# Patient Record
Sex: Male | Born: 1970
Health system: Southern US, Community
[De-identification: ages and names within clinical notes are randomized; demographics above are authoritative.]

## PROBLEM LIST (undated history)

## (undated) DIAGNOSIS — I10 Essential (primary) hypertension: Secondary | ICD-10-CM

## (undated) HISTORY — PX: MULTIPLE TOOTH EXTRACTIONS: SHX2053

---

## 2018-08-09 ENCOUNTER — Ambulatory Visit (INDEPENDENT_AMBULATORY_CARE_PROVIDER_SITE_OTHER): Payer: BLUE CROSS/BLUE SHIELD | Admitting: Physician Assistant

## 2018-08-09 ENCOUNTER — Other Ambulatory Visit: Payer: Self-pay

## 2018-08-09 ENCOUNTER — Encounter: Payer: Self-pay | Admitting: Physician Assistant

## 2018-08-09 VITALS — BP 159/101 | HR 72 | Temp 98.5°F | Resp 16 | Ht 75.0 in | Wt 222.0 lb

## 2018-08-09 DIAGNOSIS — Z23 Encounter for immunization: Secondary | ICD-10-CM

## 2018-08-09 DIAGNOSIS — Z131 Encounter for screening for diabetes mellitus: Secondary | ICD-10-CM | POA: Diagnosis not present

## 2018-08-09 DIAGNOSIS — Z125 Encounter for screening for malignant neoplasm of prostate: Secondary | ICD-10-CM

## 2018-08-09 DIAGNOSIS — Z1211 Encounter for screening for malignant neoplasm of colon: Secondary | ICD-10-CM

## 2018-08-09 DIAGNOSIS — Z0000 Encounter for general adult medical examination without abnormal findings: Secondary | ICD-10-CM | POA: Diagnosis not present

## 2018-08-09 DIAGNOSIS — Z136 Encounter for screening for cardiovascular disorders: Secondary | ICD-10-CM

## 2018-08-09 DIAGNOSIS — Z111 Encounter for screening for respiratory tuberculosis: Secondary | ICD-10-CM

## 2018-08-09 DIAGNOSIS — Z1329 Encounter for screening for other suspected endocrine disorder: Secondary | ICD-10-CM

## 2018-08-09 DIAGNOSIS — Z13220 Encounter for screening for lipoid disorders: Secondary | ICD-10-CM

## 2018-08-09 DIAGNOSIS — Z1159 Encounter for screening for other viral diseases: Secondary | ICD-10-CM

## 2018-08-09 DIAGNOSIS — Z789 Other specified health status: Secondary | ICD-10-CM

## 2018-08-09 NOTE — Patient Instructions (Signed)

## 2018-08-09 NOTE — Progress Notes (Signed)
Patient: Jorge Phillips, Male    DOB: 08-23-1970, 48 y.o.   MRN: [id number] Visit Date: 08/09/2018  Today's Provider: Margaretann LovelessJennifer M Krystalle Pilkington, PA-C   Chief Complaint  Patient presents with  . New Patient (Initial Visit)  . Establish Care   Subjective:    I,Joseline E. Rosas,RMA am acting as a Neurosurgeonscribe for PPG IndustriesJennifer M. Rosamond Andress, PA-C.   Establish Care/Annual physical exam Jorge Phillips is a 48 y.o. male who presents today for health maintenance and complete physical. He feels well. He reports exercising some. He reports he is sleeping well. [place] and [place] move down to [place]. -----------------------------------------------------------------   Review of Systems  Constitutional: Positive for appetite change and fatigue.  HENT: Negative.   Eyes: Negative.   Respiratory: Negative.   Cardiovascular: Negative.   Gastrointestinal: Negative.   Endocrine: Negative.   Genitourinary: Negative.   Musculoskeletal: Negative.   Skin: Negative.   Allergic/Immunologic: Negative.   Neurological: Positive for headaches.  Hematological: Negative.     Social History      He  reports that he has been smoking. He does not have any smokeless tobacco history on file. He reports current alcohol use of about 7.0 standard drinks of alcohol per week.       Social History   Socioeconomic History  . Marital status: Single    Spouse name: Not on file  . Number of children: Not on file  . Years of education: Not on file  . Highest education level: Not on file  Occupational History  . Not on file  Social Needs  . Financial resource strain: Not on file  . Food insecurity    Worry: Not on file    Inability: Not on file  . Transportation needs    Medical: Not on file    Non-medical: Not on file  Tobacco Use  . Smoking status: Current Every Day Smoker  Substance and Sexual Activity  . Alcohol use: Yes    Alcohol/week: 7.0 standard drinks    Types: 1 Glasses of  wine, 6 Cans of beer per week  . Drug use: Not on file  . Sexual activity: Not on file  Lifestyle  . Physical activity    Days per week: Not on file    Minutes per session: Not on file  . Stress: Not on file  Relationships  . Social Musicianconnections    Talks on phone: Not on file    Gets together: Not on file    Attends religious service: Not on file    Active member of club or organization: Not on file    Attends meetings of clubs or organizations: Not on file    Relationship status: Not on file  Other Topics Concern  . Not on file  Social History Narrative  . Not on file    No past medical history on file.   There are no active problems to display for this patient.   Family History        Family Status  Relation Name Status  . Mother  (Not Specified)  . Father  (Not Specified)        His family history includes Cancer in his father and mother.      Not on File  No current outpatient medications on file.   Patient Care Team: Reine JustBurnette, Rodolfo Notaro M, PA-C as PCP - General (Family Medicine)    Objective:    Vitals: BP (!) 159/101 (  BP Location: Right Arm, Patient Position: Sitting, Cuff Size: Large)   Pulse 72   Temp 98.5 F (36.9 C) (Oral)   Resp 16   Ht 6\' 3"  (1.905 m)   Wt 222 lb (100.7 kg)   SpO2 99%   BMI 27.75 kg/m    Vitals:   08/09/18 1527  BP: (!) 159/101  Pulse: 72  Resp: 16  Temp: 98.5 F (36.9 C)  TempSrc: Oral  SpO2: 99%  Weight: 222 lb (100.7 kg)  Height: 6\' 3"  (1.905 m)     Physical Exam Vitals signs reviewed.  Constitutional:      General: He is not in acute distress.    Appearance: Normal appearance. He is well-developed and normal weight. He is not ill-appearing.  HENT:     Head: Normocephalic and atraumatic.     Right Ear: Tympanic membrane, ear canal and external ear normal.     Left Ear: Tympanic membrane, ear canal and external ear normal.     Nose: Nose normal.     Mouth/Throat:     Mouth: Mucous membranes are moist.      Pharynx: No posterior oropharyngeal erythema.  Eyes:     General: No scleral icterus.    Extraocular Movements: Extraocular movements intact.     Conjunctiva/sclera: Conjunctivae normal.     Pupils: Pupils are equal, round, and reactive to light.  Neck:     Musculoskeletal: Normal range of motion and neck supple.     Thyroid: No thyromegaly.     Trachea: No tracheal deviation.  Cardiovascular:     Rate and Rhythm: Normal rate and regular rhythm.     Pulses: Normal pulses.     Heart sounds: Normal heart sounds. No murmur.  Pulmonary:     Effort: Pulmonary effort is normal. No respiratory distress.     Breath sounds: Normal breath sounds. No wheezing or rales.  Chest:     Chest wall: No tenderness.  Abdominal:     General: Abdomen is flat. There is no distension.     Palpations: Abdomen is soft. There is no mass.     Tenderness: There is no abdominal tenderness. There is no guarding or rebound.  Musculoskeletal: Normal range of motion.        General: No tenderness.  Lymphadenopathy:     Cervical: No cervical adenopathy.  Skin:    General: Skin is warm and dry.     Capillary Refill: Capillary refill takes less than 2 seconds.     Findings: No erythema or rash.  Neurological:     General: No focal deficit present.     Mental Status: He is alert and oriented to person, place, and time. Mental status is at baseline.     Cranial Nerves: No cranial nerve deficit.     Motor: No weakness or abnormal muscle tone.     Coordination: Coordination normal.     Gait: Gait normal.     Deep Tendon Reflexes: Reflexes are normal and symmetric. Reflexes normal.  Psychiatric:        Mood and Affect: Mood normal.        Behavior: Behavior normal.        Thought Content: Thought content normal.        Judgment: Judgment normal.      Depression Screen PHQ 2/9 Scores 08/09/2018  PHQ - 2 Score 0       Assessment & Plan:     Routine Health Maintenance and Physical Exam  Exercise  Activities and Dietary recommendations Goals   None      There is no immunization history on file for this patient.  Health Maintenance  Topic Date Due  . HIV Screening  07/29/1986  . TETANUS/TDAP  07/29/1990  . INFLUENZA VACCINE  09/29/2018     Discussed health benefits of physical activity, and encouraged him to engage in regular exercise appropriate for his age and condition.    1. Annual physical exam Normal physical exam today. Will check labs as below and f/u pending lab results. If labs are stable and WNL he will not need to have these rechecked for one year at his next annual physical exam. He is to call the office in the meantime if he has any acute issue, questions or concerns. - CBC w/Diff/Platelet - Comprehensive Metabolic Panel (CMET) - TSH - Lipid Profile - HgB A1c - PSA  2. Colon cancer screening Referral placed for initial screening colonoscopy. - Ambulatory referral to Gastroenterology  3. Prostate cancer screening No symptoms. Initial baseline. Will check labs as below and f/u pending results. - PSA  4. Diabetes mellitus screening Will check labs as below and f/u pending results. - HgB A1c  5. Thyroid disorder screen Will check labs as below and f/u pending results. - TSH  6. Encounter for lipid screening for cardiovascular disease Will check labs as below and f/u pending results. - Lipid Profile  7. Need for hepatitis B vaccination Hep B #1 Vaccine given to patient without complications. Patient sat for 15 minutes after administration and was tolerated well without adverse effects. Return in 4 weeks for Hep B #2.  - Hepatitis B vaccine adult IM  8. Need for Tdap vaccination Tdap Vaccine given to patient without complications. Patient sat for 15 minutes after administration and was tolerated well without adverse effects. - Tdap vaccine greater than or equal to 7yo IM  9. Screening-pulmonary TB Checking titers since patient is starting [employer] in  the fall for [employer] and does not have any vaccination records. Will check labs as below and f/u pending results. - QuantiFERON-TB Gold Plus  10. Screening examination for measles Will check labs as below and f/u pending results. - Measles/Mumps/Rubella Immunity  11. Varicella vaccination status unknown Will check labs as below and f/u pending results. - Varicella Zoster Abs, IgG/IgM  --------------------------------------------------------------------    Margaretann LovelessJennifer M Bogdan Vivona, PA-C  [facility]

## 2018-08-15 DIAGNOSIS — Z1159 Encounter for screening for other viral diseases: Secondary | ICD-10-CM | POA: Diagnosis not present

## 2018-08-15 DIAGNOSIS — Z111 Encounter for screening for respiratory tuberculosis: Secondary | ICD-10-CM | POA: Diagnosis not present

## 2018-08-15 DIAGNOSIS — Z789 Other specified health status: Secondary | ICD-10-CM | POA: Diagnosis not present

## 2018-08-15 DIAGNOSIS — Z136 Encounter for screening for cardiovascular disorders: Secondary | ICD-10-CM | POA: Diagnosis not present

## 2018-08-15 DIAGNOSIS — Z0000 Encounter for general adult medical examination without abnormal findings: Secondary | ICD-10-CM | POA: Diagnosis not present

## 2018-08-17 ENCOUNTER — Telehealth: Payer: Self-pay

## 2018-08-17 LAB — HEMOGLOBIN A1C
Est. average glucose Bld gHb Est-mCnc: 80 mg/dL
Hgb A1c MFr Bld: 4.4 % — ABNORMAL LOW (ref 4.8–5.6)

## 2018-08-17 LAB — CBC WITH DIFFERENTIAL/PLATELET
Basophils Absolute: 0 10*3/uL (ref 0.0–0.2)
Basos: 1 %
EOS (ABSOLUTE): 0.2 10*3/uL (ref 0.0–0.4)
Eos: 4 %
Hematocrit: 36.7 % — ABNORMAL LOW (ref 37.5–51.0)
Hemoglobin: 13.1 g/dL (ref 13.0–17.7)
Immature Grans (Abs): 0 10*3/uL (ref 0.0–0.1)
Immature Granulocytes: 0 %
Lymphocytes Absolute: 1.8 10*3/uL (ref 0.7–3.1)
Lymphs: 42 %
MCH: 32.9 pg (ref 26.6–33.0)
MCHC: 35.7 g/dL (ref 31.5–35.7)
MCV: 92 fL (ref 79–97)
Monocytes Absolute: 0.3 10*3/uL (ref 0.1–0.9)
Monocytes: 6 %
Neutrophils Absolute: 2 10*3/uL (ref 1.4–7.0)
Neutrophils: 47 %
Platelets: 185 10*3/uL (ref 150–450)
RBC: 3.98 x10E6/uL — ABNORMAL LOW (ref 4.14–5.80)
RDW: 13.6 % (ref 11.6–15.4)
WBC: 4.3 10*3/uL (ref 3.4–10.8)

## 2018-08-17 LAB — COMPREHENSIVE METABOLIC PANEL
ALT: 50 IU/L — ABNORMAL HIGH (ref 0–44)
AST: 25 IU/L (ref 0–40)
Albumin/Globulin Ratio: 1.8 (ref 1.2–2.2)
Albumin: 4.9 g/dL (ref 4.0–5.0)
Alkaline Phosphatase: 52 IU/L (ref 39–117)
BUN/Creatinine Ratio: 10 (ref 9–20)
BUN: 10 mg/dL (ref 6–24)
Bilirubin Total: 1.1 mg/dL (ref 0.0–1.2)
CO2: 25 mmol/L (ref 20–29)
Calcium: 9.5 mg/dL (ref 8.7–10.2)
Chloride: 100 mmol/L (ref 96–106)
Creatinine, Ser: 1 mg/dL (ref 0.76–1.27)
GFR calc Af Amer: 103 mL/min/{1.73_m2} (ref >59)
GFR calc non Af Amer: 89 mL/min/{1.73_m2} (ref >59)
Globulin, Total: 2.7 g/dL (ref 1.5–4.5)
Glucose: 109 mg/dL — ABNORMAL HIGH (ref 65–99)
Potassium: 3.7 mmol/L (ref 3.5–5.2)
Sodium: 138 mmol/L (ref 134–144)
Total Protein: 7.6 g/dL (ref 6.0–8.5)

## 2018-08-17 LAB — MEASLES/MUMPS/RUBELLA IMMUNITY
MUMPS ABS, IGG: >300 AU/mL (ref >10.9)
RUBEOLA AB, IGG: 35.2 AU/mL (ref >16.4)
Rubella Antibodies, IGG: 7.32 index (ref >0.99)

## 2018-08-17 LAB — QUANTIFERON-TB GOLD PLUS
QuantiFERON Mitogen Value: >10 IU/mL
QuantiFERON Nil Value: 0.02 IU/mL
QuantiFERON TB1 Ag Value: 0.02 IU/mL
QuantiFERON TB2 Ag Value: 0.02 IU/mL
QuantiFERON-TB Gold Plus: NEGATIVE

## 2018-08-17 LAB — LIPID PANEL
Chol/HDL Ratio: 4.6 ratio (ref 0.0–5.0)
Cholesterol, Total: 153 mg/dL (ref 100–199)
HDL: 33 mg/dL — ABNORMAL LOW (ref >39)
LDL Calculated: 62 mg/dL (ref 0–99)
Triglycerides: 291 mg/dL — ABNORMAL HIGH (ref 0–149)
VLDL Cholesterol Cal: 58 mg/dL — ABNORMAL HIGH (ref 5–40)

## 2018-08-17 LAB — PSA: Prostate Specific Ag, Serum: 0.3 ng/mL (ref 0.0–4.0)

## 2018-08-17 LAB — TSH: TSH: 1.54 u[IU]/mL (ref 0.450–4.500)

## 2018-08-17 LAB — VARICELLA ZOSTER ABS, IGG/IGM
Varicella IgM: <0.91 index (ref 0.00–0.90)
Varicella zoster IgG: <135 index — ABNORMAL LOW (ref >165)

## 2018-08-17 NOTE — Telephone Encounter (Signed)
-----  Message from Jennifer M Burnette, PA-C sent at 08/17/2018  1:19 PM EDT ----- MMR immune. Quantiferon gold for TB screening is negative. Varicella you are not immune, which was to be expected. Since you have never had chicken pox when you return for your second Hep B vaccine you could get the varicella vaccine as well. If you were to be around someone with chicken pox or shingles you could get it if you are not vaccinated. However, for school it is not required since your birthday is before [birth year]. Just let us know if you would like the varicella vaccine. Blood count is normal. Kidney function is normal. ALT (one liver enzyme) was borderline high. I suspect this is falsely elevated since labs were done right after we had given you the Hep B vaccine (which could increase this number). I would recommend us just follow this annually at this time and we can recheck next year. Thyroid is normal. Cholesterol is normal. Triglycerides were elevated. This is most often related to dietary habits. Limit fatty foods and red meats in diet. A1c is normal. PSA is normal. 

## 2018-08-17 NOTE — Telephone Encounter (Signed)
LMTCB

## 2018-08-20 NOTE — Telephone Encounter (Signed)
Patient advised as below. Patient verbalizes understanding and is in agreement with treatment plan.  

## 2018-08-20 NOTE — Telephone Encounter (Signed)
LMTCB

## 2018-09-07 ENCOUNTER — Ambulatory Visit (INDEPENDENT_AMBULATORY_CARE_PROVIDER_SITE_OTHER): Payer: BLUE CROSS/BLUE SHIELD | Admitting: Physician Assistant

## 2018-09-07 ENCOUNTER — Other Ambulatory Visit: Payer: Self-pay

## 2018-09-07 ENCOUNTER — Encounter: Payer: Self-pay | Admitting: Physician Assistant

## 2018-09-07 DIAGNOSIS — Z23 Encounter for immunization: Secondary | ICD-10-CM | POA: Diagnosis not present

## 2018-09-07 NOTE — Progress Notes (Signed)
Hep B Vaccine #2 given to patient without complications. Patient sat for 15 minutes after administration and was tolerated well without adverse effects. He will return in 6 months for the third and final dose.

## 2018-09-20 ENCOUNTER — Ambulatory Visit (LOCAL_COMMUNITY_HEALTH_CENTER): Payer: BLUE CROSS/BLUE SHIELD

## 2018-09-20 ENCOUNTER — Other Ambulatory Visit: Payer: Self-pay

## 2018-09-20 DIAGNOSIS — Z23 Encounter for immunization: Secondary | ICD-10-CM | POA: Diagnosis not present

## 2018-09-20 NOTE — Progress Notes (Signed)
Client does not have PCP or pharmacy as recently moved to [place] from [place]. [facility] information sheet given to client. Client does not take any medications.

## 2018-09-24 DIAGNOSIS — L814 Other melanin hyperpigmentation: Secondary | ICD-10-CM | POA: Diagnosis not present

## 2018-09-24 DIAGNOSIS — D229 Melanocytic nevi, unspecified: Secondary | ICD-10-CM | POA: Diagnosis not present

## 2018-09-25 DIAGNOSIS — Z1211 Encounter for screening for malignant neoplasm of colon: Secondary | ICD-10-CM | POA: Diagnosis not present

## 2018-09-25 DIAGNOSIS — Z01818 Encounter for other preprocedural examination: Secondary | ICD-10-CM | POA: Diagnosis not present

## 2018-09-27 ENCOUNTER — Other Ambulatory Visit: Payer: Self-pay

## 2018-09-27 ENCOUNTER — Ambulatory Visit (INDEPENDENT_AMBULATORY_CARE_PROVIDER_SITE_OTHER): Payer: BLUE CROSS/BLUE SHIELD | Admitting: Physician Assistant

## 2018-09-27 ENCOUNTER — Encounter: Payer: Self-pay | Admitting: Physician Assistant

## 2018-09-27 VITALS — BP 169/98 | HR 66 | Temp 99.2°F | Resp 16 | Wt 224.6 lb

## 2018-09-27 DIAGNOSIS — M7542 Impingement syndrome of left shoulder: Secondary | ICD-10-CM | POA: Diagnosis not present

## 2018-09-27 DIAGNOSIS — M6752 Plica syndrome, left knee: Secondary | ICD-10-CM

## 2018-09-27 DIAGNOSIS — M25562 Pain in left knee: Secondary | ICD-10-CM | POA: Diagnosis not present

## 2018-09-27 MED ORDER — METHYLPREDNISOLONE 4 MG PO TBPK: ORAL_TABLET | ORAL | 0 refills | Status: DC

## 2018-09-27 NOTE — Progress Notes (Signed)
Patient: Jorge Phillips Male    DOB: 06-19-1970   48 y.o.   MRN: [id number] Visit Date: 09/27/2018  Today's Provider: Margaretann LovelessJennifer M Zubin Pontillo, PA-C   Chief Complaint  Patient presents with  . Knee Pain  . Shoulder Pain   Subjective:     HPI  Patient here today with c/o knee pain, left side with some swelling. He also reports that a week in half ago [place] the [place] and h[place] and since then he noticed the pain and stiffness on his knee and swelling.   He is also complaining of shoulder pain, left side. Patient reports that he doesn't remember any injury. Reports that he was on vacation and went to the beach and that's went he noticed his shoulder hurting. Patient has tried Advil and other pain medicines and not work out. Patient just received his varicella shot this week on Tuesday.  No Known Allergies  No current outpatient medications on file.  Review of Systems  Constitutional: Negative.   Respiratory: Negative.   Cardiovascular: Negative.   Musculoskeletal: Positive for arthralgias, joint swelling and myalgias.  Neurological: Negative.  Negative for weakness and numbness.    Social History   Tobacco Use  . Smoking status: Current Every Day Smoker  . Smokeless tobacco: Never Used  Substance Use Topics  . Alcohol use: Yes    Alcohol/week: 7.0 standard drinks    Types: 1 Glasses of wine, 6 Cans of beer per week      Objective:   BP (!) 169/98 (BP Location: Right Arm, Patient Position: Sitting, Cuff Size: Large)   Pulse 66   Temp 99.2 F (37.3 C) (Oral)   Resp 16   Wt 224 lb 9.6 oz (101.9 kg)   BMI 28.07 kg/m  Vitals:   09/27/18 1544  BP: (!) 169/98  Pulse: 66  Resp: 16  Temp: 99.2 F (37.3 C)  TempSrc: Oral  Weight: 224 lb 9.6 oz (101.9 kg)     Physical Exam Vitals signs reviewed.  Constitutional:      General: He is not in acute distress.    Appearance: Normal appearance. He is well-developed and normal weight. He is not ill-appearing or  diaphoretic.  HENT:     Head: Normocephalic and atraumatic.  Eyes:     General: No scleral icterus.    Extraocular Movements: Extraocular movements intact.  Neck:     Musculoskeletal: Normal range of motion and neck supple. Normal range of motion. No spinous process tenderness or muscular tenderness.  Cardiovascular:     Rate and Rhythm: Normal rate and regular rhythm.     Pulses: Normal pulses.     Heart sounds: Normal heart sounds. No murmur. No friction rub. No gallop.   Pulmonary:     Effort: Pulmonary effort is normal. No respiratory distress.     Breath sounds: Normal breath sounds. No wheezing or rales.  Musculoskeletal:     Left shoulder: He exhibits decreased range of motion and tenderness. He exhibits no bony tenderness, no swelling, no effusion, no crepitus, no deformity, no laceration, no pain, no spasm, normal pulse and normal strength.     Left knee: He exhibits swelling. He exhibits normal range of motion, no effusion, no ecchymosis, no deformity, no laceration, no erythema, normal alignment, no LCL laxity, normal patellar mobility, no bony tenderness, normal meniscus and no MCL laxity. Tenderness found. Medial joint line (along medial joint line he is tender over the medial plica which  feels slightly enlarged/swollen) tenderness noted.     Comments: Left shoulder: Positive Hawkin's Impingement, negative drop arm and empty can  Left Knee: Positive patellar grind, negative varus/valgus, lachman's, anterior posterior drawer, patellar apprehension and McMurray's  Neurological:     Mental Status: He is alert.      No results found for any visits on 09/27/18.     Assessment & Plan    1. Patellofemoral arthralgia of left knee Since he has multiple joints bothering him, will try medrol dose pak as below. Patient was also given exercises to do as well. Call if worsening.  - methylPREDNISolone (MEDROL) 4 MG TBPK tablet; 6 day taper; take as directed on package instructions   Dispense: 21 tablet; Refill: 0  2. Impingement syndrome of left shoulder See above medical treatment plan. - methylPREDNISolone (MEDROL) 4 MG TBPK tablet; 6 day taper; take as directed on package instructions  Dispense: 21 tablet; Refill: 0  3. Plica syndrome of knee, left See above medical treatment plan.     Mar Daring, PA-C  [facility]

## 2018-09-27 NOTE — Patient Instructions (Signed)
Kinesiology taping for patellofemoral pain if interested Knee compression brace with activity if needed Ice after activity Voltaren gel over the counter  Patellofemoral Pain Syndrome  Patellofemoral pain syndrome is a condition in which the tissue (cartilage) on the underside of the kneecap (patella) softens or breaks down. This causes pain in the front of the knee. The condition is also called runner's knee or chondromalacia patella. Patellofemoral pain syndrome is most common in young adults who are active in sports. The knee is the largest joint in the body. The patella covers the front of the knee and is attached to muscles above and below the knee. The underside of the patella is covered with a smooth type of cartilage (synovium). The smooth surface helps the patella to glide easily when you move your knee. Patellofemoral pain syndrome causes swelling in the joint linings and bone surfaces in the knee. What are the causes? This condition may be caused by:  Overuse of the knee.  Poor alignment of your knee joints.  Weak leg muscles.  A direct blow to your kneecap. What increases the risk? You are more likely to develop this condition if:  You do a lot of activities that can wear down your kneecap. These include: ? Running. ? Squatting. ? Climbing stairs.  You start a new physical activity or exercise program.  You wear shoes that do not fit well.  You do not have good leg strength.  You are overweight. What are the signs or symptoms? The main symptom of this condition is knee pain. This may feel like a dull, aching pain underneath your patella, in the front of your knee. There may be a popping or cracking sound when you move your knee. Pain may get worse with:  Exercise.  Climbing stairs.  Running.  Jumping.  Squatting.  Kneeling.  Sitting for a long time.  Moving or pushing on your patella. How is this diagnosed? This condition may be diagnosed based  on:  Your symptoms and medical history. You may be asked about your recent physical activities and which ones cause knee pain.  A physical exam. This may include: ? Moving your patella back and forth. ? Checking your range of knee motion. ? Having you squat or jump to see if you have pain. ? Checking the strength of your leg muscles.  Imaging tests to confirm the diagnosis. These may include an MRI of your knee. How is this treated? This condition may be treated at home with rest, ice, compression, and elevation (RICE).  Other treatments may include:  Nonsteroidal anti-inflammatory drugs (NSAIDs).  Physical therapy to stretch and strengthen your leg muscles.  Shoe inserts (orthotics) to take stress off your knee.  A knee brace or knee support.  Adhesive tapes to the skin.  Surgery to remove damaged cartilage or move the patella to a better position. This is rare. Follow these instructions at home: If you have a shoe or brace:  Wear the shoe or brace as told by your health care provider. Remove it only as told by your health care provider.  Loosen the shoe or brace if your toes tingle, become numb, or turn cold and blue.  Keep the shoe or brace clean.  If the shoe or brace is not waterproof: ? Do not let it get wet. ? Cover it with a watertight covering when you take a bath or a shower. Managing pain, stiffness, and swelling  If directed, put ice on the painful area. ? If  you have a removable shoe or brace, remove it as told by your health care provider. ? Put ice in a plastic bag. ? Place a towel between your skin and the bag. ? Leave the ice on for 20 minutes, 2-3 times a day.  Move your toes often to avoid stiffness and to lessen swelling.  Rest your knee: ? Avoid activities that cause knee pain. ? When sitting or lying down, raise (elevate) the injured area above the level of your heart, whenever possible. General instructions  Take over-the-counter and  prescription medicines only as told by your health care provider.  Use splints, braces, knee supports, or walking aids as directed by your health care provider.  Perform stretching and strengthening exercises as told by your health care provider or physical therapist.  Do not use any products that contain nicotine or tobacco, such as cigarettes and e-cigarettes. These can delay healing. If you need help quitting, ask your health care provider.  Return to your normal activities as told by your health care provider. Ask your health care provider what activities are safe for you.  Keep all follow-up visits as told by your health care provider. This is important. Contact a health care provider if:  Your symptoms get worse.  You are not improving with home care. Summary  Patellofemoral pain syndrome is a condition in which the tissue (cartilage) on the underside of the kneecap (patella) softens or breaks down.  This condition causes swelling in the joint linings and bone surfaces in the knee. This leads to pain in the front of the knee.  This condition may be treated at home with rest, ice, compression, and elevation (RICE).  Use splints, braces, knee supports, or walking aids as directed by your health care provider. This information is not intended to replace advice given to you by your health care provider. Make sure you discuss any questions you have with your health care provider. Document Released: 02/02/2009 Document Revised: 03/27/2017 Document Reviewed: 03/27/2017 Elsevier Patient Education  2020 [employer]   Journal for Nurse Practitioners, 15(4), [id number]. Retrieved December 04, 2017 from http://clinicalkey.com/nursing">  Knee Exercises Ask your health care provider which exercises are safe for you. Do exercises exactly as told by your health care provider and adjust them as directed. It is normal to feel mild stretching, pulling, tightness, or discomfort as you do these  exercises. Stop right away if you feel sudden pain or your pain gets worse. Do not begin these exercises until told by your health care provider. Stretching and range-of-motion exercises These exercises warm up your muscles and joints and improve the movement and flexibility of your knee. These exercises also help to relieve pain and swelling. Knee extension, prone 1. Lie on your abdomen (prone position) on a bed. 2. Place your left / right knee just beyond the edge of the surface so your knee is not on the bed. You can put a towel under your left / right thigh just above your kneecap for comfort. 3. Relax your leg muscles and allow gravity to straighten your knee (extension). You should feel a stretch behind your left / right knee. 4. Hold this position for __________ seconds. 5. Scoot up so your knee is supported between repetitions. Repeat __________ times. Complete this exercise __________ times a day. Knee flexion, active  1. Lie on your back with both legs straight. If this causes back discomfort, bend your left / right knee so your foot is flat on the floor. 2.  Slowly slide your left / right heel back toward your buttocks. Stop when you feel a gentle stretch in the front of your knee or thigh (flexion). 3. Hold this position for __________ seconds. 4. Slowly slide your left / right heel back to the starting position. Repeat __________ times. Complete this exercise __________ times a day. Quadriceps stretch, prone  1. Lie on your abdomen on a firm surface, such as a bed or padded floor. 2. Bend your left / right knee and hold your ankle. If you cannot reach your ankle or pant leg, loop a belt around your foot and grab the belt instead. 3. Gently pull your heel toward your buttocks. Your knee should not slide out to the side. You should feel a stretch in the front of your thigh and knee (quadriceps). 4. Hold this position for __________ seconds. Repeat __________ times. Complete this  exercise __________ times a day. Hamstring, supine 1. Lie on your back (supine position). 2. Loop a belt or towel over the ball of your left / right foot. The ball of your foot is on the walking surface, right under your toes. 3. Straighten your left / right knee and slowly pull on the belt to raise your leg until you feel a gentle stretch behind your knee (hamstring). ? Do not let your knee bend while you do this. ? Keep your other leg flat on the floor. 4. Hold this position for __________ seconds. Repeat __________ times. Complete this exercise __________ times a day. Strengthening exercises These exercises build strength and endurance in your knee. Endurance is the ability to use your muscles for a long time, even after they get tired. Quadriceps, isometric This exercise stretches the muscles in front of your thigh (quadriceps) without moving your knee joint (isometric). 1. Lie on your back with your left / right leg extended and your other knee bent. Put a rolled towel or small pillow under your knee if told by your health care provider. 2. Slowly tense the muscles in the front of your left / right thigh. You should see your kneecap slide up toward your hip or see increased dimpling just above the knee. This motion will push the back of the knee toward the floor. 3. For __________ seconds, hold the muscle as tight as you can without increasing your pain. 4. Relax the muscles slowly and completely. Repeat __________ times. Complete this exercise __________ times a day. Straight leg raises This exercise stretches the muscles in front of your thigh (quadriceps) and the muscles that move your hips (hip flexors). 1. Lie on your back with your left / right leg extended and your other knee bent. 2. Tense the muscles in the front of your left / right thigh. You should see your kneecap slide up or see increased dimpling just above the knee. Your thigh may even shake a bit. 3. Keep these muscles  tight as you raise your leg 4-6 inches (10-15 cm) off the floor. Do not let your knee bend. 4. Hold this position for __________ seconds. 5. Keep these muscles tense as you lower your leg. 6. Relax your muscles slowly and completely after each repetition. Repeat __________ times. Complete this exercise __________ times a day. Hamstring, isometric 1. Lie on your back on a firm surface. 2. Bend your left / right knee about __________ degrees. 3. Dig your left / right heel into the surface as if you are trying to pull it toward your buttocks. Tighten the muscles in the  back of your thighs (hamstring) to "dig" as hard as you can without increasing any pain. 4. Hold this position for __________ seconds. 5. Release the tension gradually and allow your muscles to relax completely for __________ seconds after each repetition. Repeat __________ times. Complete this exercise __________ times a day. Hamstring curls If told by your health care provider, do this exercise while wearing ankle weights. Begin with __________ lb weights. Then increase the weight by 1 lb (0.5 kg) increments. Do not wear ankle weights that are more than __________ lb. 1. Lie on your abdomen with your legs straight. 2. Bend your left / right knee as far as you can without feeling pain. Keep your hips flat against the floor. 3. Hold this position for __________ seconds. 4. Slowly lower your leg to the starting position. Repeat __________ times. Complete this exercise __________ times a day. Squats This exercise strengthens the muscles in front of your thigh and knee (quadriceps). 1. Stand in front of a table, with your feet and knees pointing straight ahead. You may rest your hands on the table for balance but not for support. 2. Slowly bend your knees and lower your hips like you are going to sit in a chair. ? Keep your weight over your heels, not over your toes. ? Keep your lower legs upright so they are parallel with the table  legs. ? Do not let your hips go lower than your knees. ? Do not bend lower than told by your health care provider. ? If your knee pain increases, do not bend as low. 3. Hold the squat position for __________ seconds. 4. Slowly push with your legs to return to standing. Do not use your hands to pull yourself to standing. Repeat __________ times. Complete this exercise __________ times a day. Wall slides This exercise strengthens the muscles in front of your thigh and knee (quadriceps). 1. Lean your back against a smooth wall or door, and walk your feet out 18-24 inches (46-61 cm) from it. 2. Place your feet hip-width apart. 3. Slowly slide down the wall or door until your knees bend __________ degrees. Keep your knees over your heels, not over your toes. Keep your knees in line with your hips. 4. Hold this position for __________ seconds. Repeat __________ times. Complete this exercise __________ times a day. Straight leg raises This exercise strengthens the muscles that rotate the leg at the hip and move it away from your body (hip abductors). 1. Lie on your side with your left / right leg in the top position. Lie so your head, shoulder, knee, and hip line up. You may bend your bottom knee to help you keep your balance. 2. Roll your hips slightly forward so your hips are stacked directly over each other and your left / right knee is facing forward. 3. Leading with your heel, lift your top leg 4-6 inches (10-15 cm). You should feel the muscles in your outer hip lifting. ? Do not let your foot drift forward. ? Do not let your knee roll toward the ceiling. 4. Hold this position for __________ seconds. 5. Slowly return your leg to the starting position. 6. Let your muscles relax completely after each repetition. Repeat __________ times. Complete this exercise __________ times a day. Straight leg raises This exercise stretches the muscles that move your hips away from the front of the pelvis  (hip extensors). 1. Lie on your abdomen on a firm surface. You can put a pillow under your hips  if that is more comfortable. 2. Tense the muscles in your buttocks and lift your left / right leg about 4-6 inches (10-15 cm). Keep your knee straight as you lift your leg. 3. Hold this position for __________ seconds. 4. Slowly lower your leg to the starting position. 5. Let your leg relax completely after each repetition. Repeat __________ times. Complete this exercise __________ times a day. This information is not intended to replace advice given to you by your health care provider. Make sure you discuss any questions you have with your health care provider. Document Released: 12/29/2004 Document Revised: 12/05/2017 Document Reviewed: 12/05/2017 Elsevier Patient Education  2020 [employer] Inc[employer]   Shoulder Impingement Syndrome  Shoulder impingement syndrome is a condition that causes pain when connective tissues (tendons) surrounding the shoulder joint become pinched. These tendons are part of the group of muscles and tissues that help to stabilize the shoulder (rotator cuff). Beneath the rotator cuff is a fluid-filled sac (bursa) that allows the muscles and tendons to glide smoothly. The bursa may become swollen or irritated (bursitis). Bursitis, swelling in the rotator cuff tendons, or both conditions can decrease how much space is under a bone in the shoulder joint (acromion), resulting in impingement. What are the causes? Shoulder impingement syndrome may be caused by bursitis or swelling of the rotator cuff tendons, which may result from:  Repetitive overhead arm movements.  Falling onto the shoulder.  Weakness in the shoulder muscles. What increases the risk? You may be more likely to develop this condition if you:  Play sports that involve throwing, such as baseball.  Participate in sports such as tennis, volleyball, and swimming.  Work as a Education administrator, Music therapist, or Pharmacologist. Some  people are also more likely to develop impingement syndrome because of the shape of their acromion bone. What are the signs or symptoms? The main symptom of this condition is pain on the front or side of the shoulder. The pain may:  Get worse when lifting or raising the arm.  Get worse at night.  Wake you up from sleeping.  Feel sharp when the shoulder is moved and then fade to an ache. Other symptoms may include:  Tenderness.  Stiffness.  Inability to raise the arm above shoulder level or behind the body.  Weakness. How is this diagnosed? This condition may be diagnosed based on:  Your symptoms and medical history.  A physical exam.  Imaging tests, such as: ? X-rays. ? MRI. ? Ultrasound. How is this treated? This condition may be treated by:  Resting your shoulder and avoiding all activities that cause pain or put stress on the shoulder.  Icing your shoulder.  NSAIDs to help reduce pain and swelling.  One or more injections of medicines to numb the area and reduce inflammation.  Physical therapy.  Surgery. This may be needed if nonsurgical treatments have not helped. Surgery may involve repairing the rotator cuff, reshaping the acromion, or removing the bursa. Follow these instructions at home: Managing pain, stiffness, and swelling   If directed, put ice on the injured area. ? Put ice in a plastic bag. ? Place a towel between your skin and the bag. ? Leave the ice on for 20 minutes, 2-3 times a day. Activity  Rest and return to your normal activities as told by your health care provider. Ask your health care provider what activities are safe for you.  Do exercises as told by your health care provider. General instructions  Do not use any  products that contain nicotine or tobacco, such as cigarettes, e-cigarettes, and chewing tobacco. These can delay healing. If you need help quitting, ask your health care provider.  Ask your health care provider when it  is safe for you to drive.  Take over-the-counter and prescription medicines only as told by your health care provider.  Keep all follow-up visits as told by your health care provider. This is important. How is this prevented?  Give your body time to rest between periods of activity.  Be safe and responsible while being active. This will help you avoid falls.  Maintain physical fitness, including strength and flexibility. Contact a health care provider if:  Your symptoms have not improved after 1-2 months of treatment and rest.  You cannot lift your arm away from your body. Summary  Shoulder impingement syndrome is a condition that causes pain when connective tissues (tendons) surrounding the shoulder joint become pinched.  The main symptom of this condition is pain on the front or side of the shoulder.  This condition is usually treated with rest, ice, and pain medicines as needed. This information is not intended to replace advice given to you by your health care provider. Make sure you discuss any questions you have with your health care provider. Document Released: 02/14/2005 Document Revised: 06/08/2018 Document Reviewed: 08/09/2017 Elsevier Patient Education  2020 [employer]   Shoulder Exercises Ask your health care provider which exercises are safe for you. Do exercises exactly as told by your health care provider and adjust them as directed. It is normal to feel mild stretching, pulling, tightness, or discomfort as you do these exercises. Stop right away if you feel sudden pain or your pain gets worse. Do not begin these exercises until told by your health care provider. Stretching exercises External rotation and abduction This exercise is sometimes called corner stretch. This exercise rotates your arm outward (external rotation) and moves your arm out from your body (abduction). 1. Stand in a doorway with one of your feet slightly in front of the other. This is called a  staggered stance. If you cannot reach your forearms to the door frame, stand facing a corner of a room. 2. Choose one of the following positions as told by your health care provider: ? Place your hands and forearms on the door frame above your head. ? Place your hands and forearms on the door frame at the height of your head. ? Place your hands on the door frame at the height of your elbows. 3. Slowly move your weight onto your front foot until you feel a stretch across your chest and in the front of your shoulders. Keep your head and chest upright and keep your abdominal muscles tight. 4. Hold for __________ seconds. 5. To release the stretch, shift your weight to your back foot. Repeat __________ times. Complete this exercise __________ times a day. Extension, standing 1. Stand and hold a broomstick, a cane, or a similar object behind your back. ? Your hands should be a little wider than shoulder width apart. ? Your palms should face away from your back. 2. Keeping your elbows straight and your shoulder muscles relaxed, move the stick away from your body until you feel a stretch in your shoulders (extension). ? Avoid shrugging your shoulders while you move the stick. Keep your shoulder blades tucked down toward the middle of your back. 3. Hold for __________ seconds. 4. Slowly return to the starting position. Repeat __________ times. Complete this exercise __________  times a day. Range-of-motion exercises Pendulum  1. Stand near a wall or a surface that you can hold onto for balance. 2. Bend at the waist and let your left / right arm hang straight down. Use your other arm to support you. Keep your back straight and do not lock your knees. 3. Relax your left / right arm and shoulder muscles, and move your hips and your trunk so your left / right arm swings freely. Your arm should swing because of the motion of your body, not because you are using your arm or shoulder muscles. 4. Keep moving  your hips and trunk so your arm swings in the following directions, as told by your health care provider: ? Side to side. ? Forward and backward. ? In clockwise and counterclockwise circles. 5. Continue each motion for __________ seconds, or for as long as told by your health care provider. 6. Slowly return to the starting position. Repeat __________ times. Complete this exercise __________ times a day. Shoulder flexion, standing  1. Stand and hold a broomstick, a cane, or a similar object. Place your hands a little more than shoulder width apart on the object. Your left / right hand should be palm up, and your other hand should be palm down. 2. Keep your elbow straight and your shoulder muscles relaxed. Push the stick up with your healthy arm to raise your left / right arm in front of your body, and then over your head until you feel a stretch in your shoulder (flexion). ? Avoid shrugging your shoulder while you raise your arm. Keep your shoulder blade tucked down toward the middle of your back. 3. Hold for __________ seconds. 4. Slowly return to the starting position. Repeat __________ times. Complete this exercise __________ times a day. Shoulder abduction, standing 1. Stand and hold a broomstick, a cane, or a similar object. Place your hands a little more than shoulder width apart on the object. Your left / right hand should be palm up, and your other hand should be palm down. 2. Keep your elbow straight and your shoulder muscles relaxed. Push the object across your body toward your left / right side. Raise your left / right arm to the side of your body (abduction) until you feel a stretch in your shoulder. ? Do not raise your arm above shoulder height unless your health care provider tells you to do that. ? If directed, raise your arm over your head. ? Avoid shrugging your shoulder while you raise your arm. Keep your shoulder blade tucked down toward the middle of your back. 3. Hold for  __________ seconds. 4. Slowly return to the starting position. Repeat __________ times. Complete this exercise __________ times a day. Internal rotation  1. Place your left / right hand behind your back, palm up. 2. Use your other hand to dangle an exercise band, a towel, or a similar object over your shoulder. Grasp the band with your left / right hand so you are holding on to both ends. 3. Gently pull up on the band until you feel a stretch in the front of your left / right shoulder. The movement of your arm toward the center of your body is called internal rotation. ? Avoid shrugging your shoulder while you raise your arm. Keep your shoulder blade tucked down toward the middle of your back. 4. Hold for __________ seconds. 5. Release the stretch by letting go of the band and lowering your hands. Repeat __________ times. Complete this  exercise __________ times a day. Strengthening exercises External rotation  1. Sit in a stable chair without armrests. 2. Secure an exercise band to a stable object at elbow height on your left / right side. 3. Place a soft object, such as a folded towel or a small pillow, between your left / right upper arm and your body to move your elbow about 4 inches (10 cm) away from your side. 4. Hold the end of the exercise band so it is tight and there is no slack. 5. Keeping your elbow pressed against the soft object, slowly move your forearm out, away from your abdomen (external rotation). Keep your body steady so only your forearm moves. 6. Hold for __________ seconds. 7. Slowly return to the starting position. Repeat __________ times. Complete this exercise __________ times a day. Shoulder abduction  1. Sit in a stable chair without armrests, or stand up. 2. Hold a __________ weight in your left / right hand, or hold an exercise band with both hands. 3. Start with your arms straight down and your left / right palm facing in, toward your body. 4. Slowly lift  your left / right hand out to your side (abduction). Do not lift your hand above shoulder height unless your health care provider tells you that this is safe. ? Keep your arms straight. ? Avoid shrugging your shoulder while you do this movement. Keep your shoulder blade tucked down toward the middle of your back. 5. Hold for __________ seconds. 6. Slowly lower your arm, and return to the starting position. Repeat __________ times. Complete this exercise __________ times a day. Shoulder extension 1. Sit in a stable chair without armrests, or stand up. 2. Secure an exercise band to a stable object in front of you so it is at shoulder height. 3. Hold one end of the exercise band in each hand. Your palms should face each other. 4. Straighten your elbows and lift your hands up to shoulder height. 5. Step back, away from the secured end of the exercise band, until the band is tight and there is no slack. 6. Squeeze your shoulder blades together as you pull your hands down to the sides of your thighs (extension). Stop when your hands are straight down by your sides. Do not let your hands go behind your body. 7. Hold for __________ seconds. 8. Slowly return to the starting position. Repeat __________ times. Complete this exercise __________ times a day. Shoulder row 1. Sit in a stable chair without armrests, or stand up. 2. Secure an exercise band to a stable object in front of you so it is at waist height. 3. Hold one end of the exercise band in each hand. Position your palms so that your thumbs are facing the ceiling (neutral position). 4. Bend each of your elbows to a 90-degree angle (right angle) and keep your upper arms at your sides. 5. Step back until the band is tight and there is no slack. 6. Slowly pull your elbows back behind you. 7. Hold for __________ seconds. 8. Slowly return to the starting position. Repeat __________ times. Complete this exercise __________ times a day. Shoulder  press-ups  1. Sit in a stable chair that has armrests. Sit upright, with your feet flat on the floor. 2. Put your hands on the armrests so your elbows are bent and your fingers are pointing forward. Your hands should be about even with the sides of your body. 3. Push down on the armrests and use  your arms to lift yourself off the chair. Straighten your elbows and lift yourself up as much as you comfortably can. ? Move your shoulder blades down, and avoid letting your shoulders move up toward your ears. ? Keep your feet on the ground. As you get stronger, your feet should support less of your body weight as you lift yourself up. 4. Hold for __________ seconds. 5. Slowly lower yourself back into the chair. Repeat __________ times. Complete this exercise __________ times a day. Wall push-ups  1. Stand so you are facing a stable wall. Your feet should be about one arm-length away from the wall. 2. Lean forward and place your palms on the wall at shoulder height. 3. Keep your feet flat on the floor as you bend your elbows and lean forward toward the wall. 4. Hold for __________ seconds. 5. Straighten your elbows to push yourself back to the starting position. Repeat __________ times. Complete this exercise __________ times a day. This information is not intended to replace advice given to you by your health care provider. Make sure you discuss any questions you have with your health care provider. Document Released: 12/29/2004 Document Revised: 06/08/2018 Document Reviewed: 03/16/2018 Elsevier Patient Education  2020 [employer].

## 2018-10-02 ENCOUNTER — Telehealth: Payer: Self-pay

## 2018-10-02 NOTE — Telephone Encounter (Signed)
No answer/voicemail not set up. *If patient calls back please Tell him his forms are ready for pick up. Thanks.

## 2018-10-04 NOTE — Telephone Encounter (Signed)
Pt advised. Thanks TNP  °

## 2018-10-12 ENCOUNTER — Telehealth: Payer: Self-pay | Admitting: Physician Assistant

## 2018-10-12 NOTE — Telephone Encounter (Signed)
Disregard message. Per Jiles Garter pt does need a TD shot. Patient is scheduled for a nurse appt.

## 2018-10-12 NOTE — Telephone Encounter (Signed)
Pt needing to get a TD Shot for going back to college.  Please call pt back to advise.  Thanks, [employer]

## 2018-10-12 NOTE — Telephone Encounter (Signed)
Left message on pt vm advising him that he received a tetanus shot 08/09/2018.

## 2018-10-15 ENCOUNTER — Ambulatory Visit: Payer: Self-pay | Admitting: Physician Assistant

## 2018-10-19 NOTE — Progress Notes (Signed)
       Patient: Jorge Phillips Male    DOB: 11/19/1970   48 y.o.   MRN: [id number] Visit Date: 10/22/2018  Today's Provider: Mar Daring, PA-C   No chief complaint on file.  Subjective:     HPI   Patient is here for Td booster. He is taking classes at [employer] and is required to have 3 documented tetanus vaccines.  He received Tdap in June 2020. Was advised to get a Td booster 1 months later, then receive another Td booster in 6 months following the second per the campus health LPN.   No Known Allergies   Current Outpatient Medications:  .  methylPREDNISolone (MEDROL) 4 MG TBPK tablet, 6 day taper; take as directed on package instructions, Disp: 21 tablet, Rfl: 0  Review of Systems  Constitutional: Negative for appetite change, chills and fever.  Respiratory: Negative for chest tightness, shortness of breath and wheezing.   Cardiovascular: Negative for chest pain and palpitations.  Gastrointestinal: Negative for abdominal pain, nausea and vomiting.    Social History   Tobacco Use  . Smoking status: Current Every Day Smoker  . Smokeless tobacco: Never Used  Substance Use Topics  . Alcohol use: Yes    Alcohol/week: 7.0 standard drinks    Types: 1 Glasses of wine, 6 Cans of beer per week      Objective:   There were no vitals taken for this visit. There were no vitals filed for this visit.   Physical Exam Vitals signs reviewed.  Constitutional:      Appearance: Normal appearance.  Pulmonary:     Effort: No respiratory distress.  Neurological:     Mental Status: He is alert.      No results found for any visits on 10/22/18.     Assessment & Plan    1. Need for Td vaccine Td booster Vaccine given to patient without complications. Patient sat for 15 minutes after administration and was tolerated well without adverse effects. Return in 6 months for the 3rd for school.  - Td : Tetanus/diphtheria >7yo Preservative  free     Mar Daring, PA-C   [place]

## 2018-10-22 ENCOUNTER — Other Ambulatory Visit: Payer: Self-pay

## 2018-10-22 ENCOUNTER — Ambulatory Visit (INDEPENDENT_AMBULATORY_CARE_PROVIDER_SITE_OTHER): Payer: BC Managed Care – PPO | Admitting: Physician Assistant

## 2018-10-22 DIAGNOSIS — Z23 Encounter for immunization: Secondary | ICD-10-CM | POA: Diagnosis not present

## 2018-11-19 ENCOUNTER — Encounter: Payer: Self-pay | Admitting: Cardiovascular Disease

## 2018-11-19 DIAGNOSIS — I10 Essential (primary) hypertension: Secondary | ICD-10-CM | POA: Diagnosis not present

## 2018-11-19 DIAGNOSIS — Z13220 Encounter for screening for lipoid disorders: Secondary | ICD-10-CM | POA: Diagnosis not present

## 2018-11-20 DIAGNOSIS — I10 Essential (primary) hypertension: Secondary | ICD-10-CM | POA: Diagnosis not present

## 2018-11-20 DIAGNOSIS — Z23 Encounter for immunization: Secondary | ICD-10-CM | POA: Diagnosis not present

## 2018-11-26 DIAGNOSIS — M25512 Pain in left shoulder: Secondary | ICD-10-CM | POA: Diagnosis not present

## 2018-11-30 DIAGNOSIS — M25512 Pain in left shoulder: Secondary | ICD-10-CM | POA: Diagnosis not present

## 2018-12-06 DIAGNOSIS — I10 Essential (primary) hypertension: Secondary | ICD-10-CM | POA: Diagnosis not present

## 2018-12-07 DIAGNOSIS — M25512 Pain in left shoulder: Secondary | ICD-10-CM | POA: Diagnosis not present

## 2018-12-11 DIAGNOSIS — M25512 Pain in left shoulder: Secondary | ICD-10-CM | POA: Diagnosis not present

## 2018-12-14 DIAGNOSIS — M25512 Pain in left shoulder: Secondary | ICD-10-CM | POA: Diagnosis not present

## 2018-12-18 DIAGNOSIS — M25512 Pain in left shoulder: Secondary | ICD-10-CM | POA: Diagnosis not present

## 2018-12-21 DIAGNOSIS — M25512 Pain in left shoulder: Secondary | ICD-10-CM | POA: Diagnosis not present

## 2018-12-25 DIAGNOSIS — M25512 Pain in left shoulder: Secondary | ICD-10-CM | POA: Diagnosis not present

## 2018-12-28 DIAGNOSIS — M25512 Pain in left shoulder: Secondary | ICD-10-CM | POA: Diagnosis not present

## 2019-01-03 DIAGNOSIS — M25512 Pain in left shoulder: Secondary | ICD-10-CM | POA: Diagnosis not present

## 2019-01-08 DIAGNOSIS — M25512 Pain in left shoulder: Secondary | ICD-10-CM | POA: Diagnosis not present

## 2019-01-15 DIAGNOSIS — Z20828 Contact with and (suspected) exposure to other viral communicable diseases: Secondary | ICD-10-CM | POA: Diagnosis not present

## 2019-01-15 DIAGNOSIS — J069 Acute upper respiratory infection, unspecified: Secondary | ICD-10-CM | POA: Diagnosis not present

## 2019-01-18 DIAGNOSIS — M75102 Unspecified rotator cuff tear or rupture of left shoulder, not specified as traumatic: Secondary | ICD-10-CM | POA: Diagnosis not present

## 2019-02-01 ENCOUNTER — Other Ambulatory Visit: Payer: Self-pay | Admitting: Physician Assistant

## 2019-02-01 ENCOUNTER — Other Ambulatory Visit (HOSPITAL_COMMUNITY): Payer: Self-pay | Admitting: Physician Assistant

## 2019-02-01 DIAGNOSIS — M75102 Unspecified rotator cuff tear or rupture of left shoulder, not specified as traumatic: Secondary | ICD-10-CM

## 2019-02-08 ENCOUNTER — Ambulatory Visit: Payer: Self-pay | Admitting: Physician Assistant

## 2019-02-14 ENCOUNTER — Ambulatory Visit: Payer: Self-pay

## 2019-02-14 ENCOUNTER — Other Ambulatory Visit: Payer: Self-pay

## 2019-02-25 ENCOUNTER — Ambulatory Visit
Admission: RE | Admit: 2019-02-25 | Discharge: 2019-02-25 | Disposition: A | Discharge status: Home / Self Care | Payer: BC Managed Care – PPO | Source: Ambulatory Visit | Attending: Physician Assistant | Admitting: Physician Assistant

## 2019-02-25 ENCOUNTER — Other Ambulatory Visit: Payer: Self-pay

## 2019-02-25 DIAGNOSIS — M75112 Incomplete rotator cuff tear or rupture of left shoulder, not specified as traumatic: Secondary | ICD-10-CM | POA: Diagnosis not present

## 2019-02-25 DIAGNOSIS — M659 Synovitis and tenosynovitis, unspecified: Secondary | ICD-10-CM | POA: Insufficient documentation

## 2019-02-25 DIAGNOSIS — M75102 Unspecified rotator cuff tear or rupture of left shoulder, not specified as traumatic: Secondary | ICD-10-CM

## 2019-02-25 DIAGNOSIS — M25512 Pain in left shoulder: Secondary | ICD-10-CM | POA: Diagnosis not present

## 2019-02-25 DIAGNOSIS — G8929 Other chronic pain: Secondary | ICD-10-CM | POA: Diagnosis not present

## 2019-02-25 MED ORDER — LIDOCAINE HCL (PF) 1 % IJ SOLN
5.0000 mL | Freq: Once | INTRAMUSCULAR | Status: AC
  Administered 2019-02-25: 5 mL
  Filled 2019-02-25: qty 5

## 2019-02-25 MED ORDER — IOHEXOL 180 MG/ML  SOLN
15.0000 mL | Freq: Once | INTRAMUSCULAR | Status: AC | PRN
  Administered 2019-02-25: 15 mL

## 2019-02-25 MED ORDER — SODIUM CHLORIDE (PF) 0.9 % IJ SOLN
5.0000 mL | Freq: Once | INTRAMUSCULAR | Status: AC
  Administered 2019-02-25: 5 mL

## 2019-02-25 MED ORDER — GADOBUTROL 1 MMOL/ML IV SOLN
0.0500 mL | Freq: Once | INTRAVENOUS | Status: AC | PRN
  Administered 2019-02-25: 0.05 mL

## 2019-03-04 DIAGNOSIS — M75102 Unspecified rotator cuff tear or rupture of left shoulder, not specified as traumatic: Secondary | ICD-10-CM | POA: Diagnosis not present

## 2019-03-13 DIAGNOSIS — M75122 Complete rotator cuff tear or rupture of left shoulder, not specified as traumatic: Secondary | ICD-10-CM | POA: Diagnosis not present

## 2019-03-21 ENCOUNTER — Other Ambulatory Visit: Payer: Self-pay | Admitting: Orthopedic Surgery

## 2019-03-22 ENCOUNTER — Encounter: Payer: Self-pay | Admitting: Cardiovascular Disease

## 2019-03-22 DIAGNOSIS — I10 Essential (primary) hypertension: Secondary | ICD-10-CM | POA: Diagnosis not present

## 2019-03-25 ENCOUNTER — Other Ambulatory Visit: Payer: Self-pay

## 2019-03-25 ENCOUNTER — Encounter: Payer: Self-pay | Admitting: Orthopedic Surgery

## 2019-03-25 NOTE — Discharge Instructions (Signed)
General Anesthesia, Adult, Care After This sheet gives you information about how to care for yourself after your procedure. Your health care provider may also give you more specific instructions. If you have problems or questions, contact your health care provider. What can I expect after the procedure? After the procedure, the following side effects are common:  Pain or discomfort at the IV site.  Nausea.  Vomiting.  Sore throat.  Trouble concentrating.  Feeling cold or chills.  Weak or tired.  Sleepiness and fatigue.  Soreness and body aches. These side effects can affect parts of the body that were not involved in surgery. Follow these instructions at home:  For at least 24 hours after the procedure:  Have a responsible adult stay with you. It is important to have someone help care for you until you are awake and alert.  Rest as needed.  Do not: ? Participate in activities in which you could fall or become injured. ? Drive. ? Use heavy machinery. ? Drink alcohol. ? Take sleeping pills or medicines that cause drowsiness. ? Make important decisions or sign legal documents. ? Take care of children on your own. Eating and drinking  Follow any instructions from your health care provider about eating or drinking restrictions.  When you feel hungry, start by eating small amounts of foods that are soft and easy to digest (bland), such as toast. Gradually return to your regular diet.  Drink enough fluid to keep your urine pale yellow.  If you vomit, rehydrate by drinking water, juice, or clear broth. General instructions  If you have sleep apnea, surgery and certain medicines can increase your risk for breathing problems. Follow instructions from your health care provider about wearing your sleep device: ? Anytime you are sleeping, including during daytime naps. ? While taking prescription pain medicines, sleeping medicines, or medicines that make you drowsy.  Return to  your normal activities as told by your health care provider. Ask your health care provider what activities are safe for you.  Take over-the-counter and prescription medicines only as told by your health care provider.  If you smoke, do not smoke without supervision.  Keep all follow-up visits as told by your health care provider. This is important. Contact a health care provider if:  You have nausea or vomiting that does not get better with medicine.  You cannot eat or drink without vomiting.  You have pain that does not get better with medicine.  You are unable to pass urine.  You develop a skin rash.  You have a fever.  You have redness around your IV site that gets worse. Get help right away if:  You have difficulty breathing.  You have chest pain.  You have blood in your urine or stool, or you vomit blood. Summary  After the procedure, it is common to have a sore throat or nausea. It is also common to feel tired.  Have a responsible adult stay with you for the first 24 hours after general anesthesia. It is important to have someone help care for you until you are awake and alert.  When you feel hungry, start by eating small amounts of foods that are soft and easy to digest (bland), such as toast. Gradually return to your regular diet.  Drink enough fluid to keep your urine pale yellow.  Return to your normal activities as told by your health care provider. Ask your health care provider what activities are safe for you. This information is not   intended to replace advice given to you by your health care provider. Make sure you discuss any questions you have with your health care provider. Document Revised: 02/17/2017 Document Reviewed: 09/30/2016 Elsevier Patient Education  2020 Elsevier Inc[employer]  

## 2019-03-25 NOTE — Anesthesia Preprocedure Evaluation (Addendum)
Anesthesia Evaluation  Patient identified by MRN, date of birth, ID band Patient awake    Reviewed: Allergy & Precautions, H&P , NPO status , Patient's Chart, lab work & pertinent test results  Airway Mallampati: II  TM Distance: >3 FB Neck ROM: full    Dental no notable dental hx.    Pulmonary former smoker,    Pulmonary exam normal breath sounds clear to auscultation       Cardiovascular hypertension, Normal cardiovascular exam Rhythm:regular Rate:Normal     Neuro/Psych    GI/Hepatic   Endo/Other    Renal/GU      Musculoskeletal   Abdominal   Peds  Hematology   Anesthesia Other Findings   Reproductive/Obstetrics                            Anesthesia Physical Anesthesia Plan  ASA: II  Anesthesia Plan: General LMA   Post-op Pain Management:  Regional for Post-op pain   Induction:   PONV Risk Score and Plan: 2 and Treatment may vary due to age or medical condition, Ondansetron, Dexamethasone and Midazolam  Airway Management Planned:   Additional Equipment:   Intra-op Plan:   Post-operative Plan:   Informed Consent: I have reviewed the patients History and Physical, chart, labs and discussed the procedure including the risks, benefits and alternatives for the proposed anesthesia with the patient or authorized representative who has indicated his/her understanding and acceptance.     Dental Advisory Given  Plan Discussed with: CRNA  Anesthesia Plan Comments:         Anesthesia Quick Evaluation

## 2019-03-27 ENCOUNTER — Other Ambulatory Visit: Payer: Self-pay

## 2019-03-27 ENCOUNTER — Other Ambulatory Visit
Admission: RE | Admit: 2019-03-27 | Discharge: 2019-03-27 | Disposition: A | Discharge status: Home / Self Care | Payer: BC Managed Care – PPO | Source: Ambulatory Visit | Attending: Orthopedic Surgery | Admitting: Orthopedic Surgery

## 2019-03-27 DIAGNOSIS — Z20822 Contact with and (suspected) exposure to covid-19: Secondary | ICD-10-CM | POA: Insufficient documentation

## 2019-03-27 DIAGNOSIS — Z01812 Encounter for preprocedural laboratory examination: Secondary | ICD-10-CM | POA: Insufficient documentation

## 2019-03-27 LAB — SARS CORONAVIRUS 2 (TAT 6-24 HRS): SARS Coronavirus 2: NEGATIVE

## 2019-03-29 ENCOUNTER — Encounter
Admission: RE | Disposition: A | Discharge status: Home / Self Care | Payer: Self-pay | Source: Home / Self Care | Attending: Orthopedic Surgery

## 2019-03-29 ENCOUNTER — Ambulatory Visit: Payer: BC Managed Care – PPO | Admitting: Anesthesiology

## 2019-03-29 ENCOUNTER — Other Ambulatory Visit: Payer: Self-pay

## 2019-03-29 ENCOUNTER — Encounter: Payer: Self-pay | Admitting: Orthopedic Surgery

## 2019-03-29 ENCOUNTER — Ambulatory Visit
Admission: RE | Admit: 2019-03-29 | Discharge: 2019-03-29 | Disposition: A | Discharge status: Home / Self Care | Payer: BC Managed Care – PPO | Attending: Orthopedic Surgery | Admitting: Orthopedic Surgery

## 2019-03-29 DIAGNOSIS — Z87891 Personal history of nicotine dependence: Secondary | ICD-10-CM | POA: Insufficient documentation

## 2019-03-29 DIAGNOSIS — Z791 Long term (current) use of non-steroidal anti-inflammatories (NSAID): Secondary | ICD-10-CM | POA: Insufficient documentation

## 2019-03-29 DIAGNOSIS — X58XXXA Exposure to other specified factors, initial encounter: Secondary | ICD-10-CM | POA: Diagnosis not present

## 2019-03-29 DIAGNOSIS — S43432A Superior glenoid labrum lesion of left shoulder, initial encounter: Secondary | ICD-10-CM | POA: Insufficient documentation

## 2019-03-29 DIAGNOSIS — M7552 Bursitis of left shoulder: Secondary | ICD-10-CM | POA: Insufficient documentation

## 2019-03-29 DIAGNOSIS — M25312 Other instability, left shoulder: Secondary | ICD-10-CM | POA: Diagnosis not present

## 2019-03-29 DIAGNOSIS — M65812 Other synovitis and tenosynovitis, left shoulder: Secondary | ICD-10-CM | POA: Diagnosis not present

## 2019-03-29 DIAGNOSIS — Z79899 Other long term (current) drug therapy: Secondary | ICD-10-CM | POA: Diagnosis not present

## 2019-03-29 DIAGNOSIS — G8918 Other acute postprocedural pain: Secondary | ICD-10-CM | POA: Diagnosis not present

## 2019-03-29 DIAGNOSIS — I10 Essential (primary) hypertension: Secondary | ICD-10-CM | POA: Diagnosis not present

## 2019-03-29 DIAGNOSIS — M25812 Other specified joint disorders, left shoulder: Secondary | ICD-10-CM | POA: Diagnosis not present

## 2019-03-29 DIAGNOSIS — M19012 Primary osteoarthritis, left shoulder: Secondary | ICD-10-CM | POA: Insufficient documentation

## 2019-03-29 DIAGNOSIS — M25512 Pain in left shoulder: Secondary | ICD-10-CM | POA: Diagnosis not present

## 2019-03-29 DIAGNOSIS — S46292D Other injury of muscle, fascia and tendon of other parts of biceps, left arm, subsequent encounter: Secondary | ICD-10-CM | POA: Diagnosis not present

## 2019-03-29 DIAGNOSIS — M7542 Impingement syndrome of left shoulder: Secondary | ICD-10-CM | POA: Diagnosis not present

## 2019-03-29 HISTORY — PX: SHOULDER ARTHROSCOPY WITH ROTATOR CUFF REPAIR AND OPEN BICEPS TENODESIS: SHX6677

## 2019-03-29 HISTORY — DX: Essential (primary) hypertension: I10

## 2019-03-29 SURGERY — SHOULDER ARTHROSCOPY WITH ROTATOR CUFF REPAIR AND OPEN BICEPS TENODESIS
Anesthesia: General | Site: Shoulder | Laterality: Left

## 2019-03-29 MED ORDER — DEXAMETHASONE SODIUM PHOSPHATE 4 MG/ML IJ SOLN
INTRAMUSCULAR | Status: DC | PRN
  Administered 2019-03-29: 4 mg via INTRAVENOUS

## 2019-03-29 MED ORDER — GLYCOPYRROLATE 0.2 MG/ML IJ SOLN
INTRAMUSCULAR | Status: DC | PRN
  Administered 2019-03-29: .1 mg via INTRAVENOUS

## 2019-03-29 MED ORDER — BUPIVACAINE HCL (PF) 0.5 % IJ SOLN
INTRAMUSCULAR | Status: DC | PRN
  Administered 2019-03-29: 20 mL via PERINEURAL

## 2019-03-29 MED ORDER — ONDANSETRON HCL 4 MG PO TABS: 4.0000 mg | ORAL_TABLET | Freq: Three times a day (TID) | ORAL | 0 refills | Status: AC | PRN

## 2019-03-29 MED ORDER — LIDOCAINE HCL (CARDIAC) PF 100 MG/5ML IV SOSY
PREFILLED_SYRINGE | INTRAVENOUS | Status: DC | PRN
  Administered 2019-03-29: 40 mg via INTRATRACHEAL

## 2019-03-29 MED ORDER — MIDAZOLAM HCL 5 MG/5ML IJ SOLN
INTRAMUSCULAR | Status: DC | PRN
  Administered 2019-03-29: 2 mg via INTRAVENOUS

## 2019-03-29 MED ORDER — LACTATED RINGERS IV SOLN
10.0000 mL/h | INTRAVENOUS | Status: DC
  Administered 2019-03-29: 11:00:00 10 mL/h via INTRAVENOUS

## 2019-03-29 MED ORDER — FENTANYL CITRATE (PF) 100 MCG/2ML IJ SOLN
INTRAMUSCULAR | Status: DC | PRN
  Administered 2019-03-29: 100 ug via INTRAVENOUS

## 2019-03-29 MED ORDER — PROPOFOL 10 MG/ML IV BOLUS
INTRAVENOUS | Status: DC | PRN
  Administered 2019-03-29: 150 mg via INTRAVENOUS

## 2019-03-29 MED ORDER — CHLORHEXIDINE GLUCONATE CLOTH 2 % EX PADS: 6.0000 | MEDICATED_PAD | Freq: Once | CUTANEOUS | Status: DC

## 2019-03-29 MED ORDER — ONDANSETRON HCL 4 MG/2ML IJ SOLN
INTRAMUSCULAR | Status: DC | PRN
  Administered 2019-03-29: 4 mg via INTRAVENOUS

## 2019-03-29 MED ORDER — EPHEDRINE SULFATE 50 MG/ML IJ SOLN
INTRAMUSCULAR | Status: DC | PRN
  Administered 2019-03-29 (×6): 5 mg via INTRAVENOUS

## 2019-03-29 MED ORDER — OXYCODONE HCL 5 MG PO TABS: 5.0000 mg | ORAL_TABLET | ORAL | 0 refills | Status: AC | PRN

## 2019-03-29 MED ORDER — CEFAZOLIN SODIUM-DEXTROSE 2-4 GM/100ML-% IV SOLN
2.0000 g | INTRAVENOUS | Status: AC
  Administered 2019-03-29: 13:00:00 2 g via INTRAVENOUS

## 2019-03-29 MED ORDER — BUPIVACAINE LIPOSOME 1.3 % IJ SUSP
INTRAMUSCULAR | Status: DC | PRN
  Administered 2019-03-29: 20 mL via PERINEURAL

## 2019-03-29 SURGICAL SUPPLY — 57 items
ADAPTER IRRIG TUBE 2 SPIKE SOL (ADAPTER) ×6 IMPLANT
ANCHOR SUT  BIOC ST 3X14.5 (Anchor) ×8 IMPLANT
ANCHOR SUT BIOC ST 3X14.5 (Anchor) ×4 IMPLANT
BUR RADIUS 4.0X18.5 (BURR) ×3 IMPLANT
BUR RADIUS 5.5 (BURR) ×3 IMPLANT
CANNULA 5.75X7 CRYSTAL CLEAR (CANNULA) ×6 IMPLANT
CANNULA PARTIAL THREAD 2X7 (CANNULA) ×6 IMPLANT
CLOSURE WOUND 1/2 X4 (GAUZE/BANDAGES/DRESSINGS) ×2
COOLER POLAR GLACIER W/PUMP (MISCELLANEOUS) ×3 IMPLANT
COVER LIGHT HANDLE UNIVERSAL (MISCELLANEOUS) ×6 IMPLANT
COVER WAND RF STERILE (DRAPES) ×3 IMPLANT
DEVICE SUCT BLK HOLE OR FLOOR (MISCELLANEOUS) ×3 IMPLANT
DRAPE IMP U-DRAPE 54X76 (DRAPES) ×6 IMPLANT
DRAPE INCISE IOBAN 66X45 STRL (DRAPES) ×3 IMPLANT
DRAPE U-SHAPE 48X52 POLY STRL (PACKS) ×3 IMPLANT
DURAPREP 26ML APPLICATOR (WOUND CARE) ×9 IMPLANT
ELECT REM PT RETURN 9FT ADLT (ELECTROSURGICAL) ×3
ELECTRODE REM PT RTRN 9FT ADLT (ELECTROSURGICAL) ×1 IMPLANT
GAUZE SPONGE 4X4 12PLY STRL (GAUZE/BANDAGES/DRESSINGS) ×6 IMPLANT
GAUZE XEROFORM 1X8 LF (GAUZE/BANDAGES/DRESSINGS) ×3 IMPLANT
GLOVE BIOGEL PI IND STRL 9 (GLOVE) ×1 IMPLANT
GLOVE BIOGEL PI INDICATOR 9 (GLOVE) ×2
GLOVE SURG 9.0 ORTHO LTXF (GLOVE) ×6 IMPLANT
GOWN STRL REUS TWL 2XL XL LVL4 (GOWN DISPOSABLE) ×3 IMPLANT
GOWN STRL REUS W/ TWL LRG LVL3 (GOWN DISPOSABLE) ×1 IMPLANT
GOWN STRL REUS W/ TWL LRG LVL4 (GOWN DISPOSABLE) ×1 IMPLANT
GOWN STRL REUS W/TWL LRG LVL3 (GOWN DISPOSABLE) ×2
GOWN STRL REUS W/TWL LRG LVL4 (GOWN DISPOSABLE) ×2
IV LACTATED RINGER IRRG 3000ML (IV SOLUTION) ×40
IV LR IRRIG 3000ML ARTHROMATIC (IV SOLUTION) ×20 IMPLANT
KIT STABILIZATION SHOULDER (MISCELLANEOUS) ×3 IMPLANT
KIT SUTURETAK 3.0 INSERT PERC (KITS) ×3 IMPLANT
KIT TURNOVER KIT A (KITS) ×3 IMPLANT
MANIFOLD NEPTUNE II (INSTRUMENTS) ×3 IMPLANT
MASK FACE SPIDER DISP (MASK) ×3 IMPLANT
MAT ABSORB  FLUID 56X50 GRAY (MISCELLANEOUS) ×4
MAT ABSORB FLUID 56X50 GRAY (MISCELLANEOUS) ×2 IMPLANT
NDL SAFETY ECLIPSE 18X1.5 (NEEDLE) ×1 IMPLANT
NEEDLE HYPO 18GX1.5 SHARP (NEEDLE) ×2
NEEDLE HYPO 22GX1.5 SAFETY (NEEDLE) ×3 IMPLANT
NS IRRIG 500ML POUR BTL (IV SOLUTION) ×3 IMPLANT
PACK ARTHROSCOPY SHOULDER (MISCELLANEOUS) ×3 IMPLANT
PAD WRAPON POLAR SHDR UNIV (MISCELLANEOUS) ×1 IMPLANT
SET TUBE SUCT SHAVER OUTFL 24K (TUBING) ×3 IMPLANT
SET TUBE TIP INTRA-ARTICULAR (MISCELLANEOUS) ×3 IMPLANT
STRIP CLOSURE SKIN 1/2X4 (GAUZE/BANDAGES/DRESSINGS) ×4 IMPLANT
SUT ETHILON 4-0 (SUTURE) ×6
SUT ETHILON 4-0 FS2 18XMFL BLK (SUTURE) ×3
SUT LASSO 90 DEG SD STR (SUTURE) ×3 IMPLANT
SUTURE ETHLN 4-0 FS2 18XMF BLK (SUTURE) ×3 IMPLANT
SYR 10ML LL (SYRINGE) ×3 IMPLANT
TAPE MICROFOAM 4IN (TAPE) ×3 IMPLANT
TUBING ARTHRO INFLOW-ONLY STRL (TUBING) ×3 IMPLANT
TUBING CONNECTING 10 (TUBING) ×2 IMPLANT
TUBING CONNECTING 10' (TUBING) ×1
WAND HAND CNTRL MULTIVAC 90 (MISCELLANEOUS) ×3 IMPLANT
WRAPON POLAR PAD SHDR UNIV (MISCELLANEOUS) ×3

## 2019-03-29 NOTE — Anesthesia Procedure Notes (Signed)
Anesthesia Regional Block: Interscalene brachial plexus block   Pre-Anesthetic Checklist: ,, timeout performed, Correct Patient, Correct Site, Correct Laterality, Correct Procedure, Correct Position, site marked, Risks and benefits discussed,  Surgical consent,  Pre-op evaluation,  At surgeon's request and post-op pain management  Laterality: Left  Prep: chloraprep       Needles:  Injection technique: Single-shot  Needle Type: Stimiplex     Needle Length: 10cm  Needle Gauge: 21     Additional Needles:   Procedures:,,,, ultrasound used (permanent image in chart),,,,  Narrative:  Start time: 03/29/2019 11:24 AM End time: 03/29/2019 11:33 AM Injection made incrementally with aspirations every 5 mL.  Performed by: Personally  Anesthesiologist: Ranee Gosselin, MD  Additional Notes: Functioning IV was confirmed and monitors applied. Ultrasound guidance: relevant anatomy identified, needle position confirmed, local anesthetic spread visualized around nerve(s)., vascular puncture avoided.  Image printed for medical record.  Negative aspiration and no paresthesias; incremental administration of local anesthetic. The patient tolerated the procedure well. Vitals signes recorded in RN notes.

## 2019-03-29 NOTE — Anesthesia Procedure Notes (Signed)
Procedure Name: LMA Insertion Date/Time: 03/29/2019 1:00 PM Performed by: Jimmy Picket, CRNA Pre-anesthesia Checklist: Patient identified, Emergency Drugs available, Suction available, Timeout performed and Patient being monitored Patient Re-evaluated:Patient Re-evaluated prior to induction Oxygen Delivery Method: Circle system utilized Preoxygenation: Pre-oxygenation with 100% oxygen Induction Type: IV induction LMA: LMA inserted LMA Size: 4.0 Number of attempts: 1 Placement Confirmation: positive ETCO2 and breath sounds checked- equal and bilateral Tube secured with: Tape

## 2019-03-29 NOTE — Transfer of Care (Signed)
Immediate Anesthesia Transfer of Care Note  Patient: Jorge Phillips  Procedure(s) Performed: left shoulder ARTHROSCOPY biceps tendonesis possible rotator cuff tear & Labral tear, subacrominal decompression and distal clavicle excision (Left Shoulder)  Patient Location: PACU  Anesthesia Type: General LMA  Level of Consciousness: awake, alert  and patient cooperative  Airway and Oxygen Therapy: Patient Spontanous Breathing and Patient connected to supplemental oxygen  Post-op Assessment: Post-op Vital signs reviewed, Patient's Cardiovascular Status Stable, Respiratory Function Stable, Patent Airway and No signs of Nausea or vomiting  Post-op Vital Signs: Reviewed and stable  Complications: No apparent anesthesia complications

## 2019-03-29 NOTE — Anesthesia Postprocedure Evaluation (Signed)
Anesthesia Post Note  Patient: Taro Hidrogo  Procedure(s) Performed: left shoulder ARTHROSCOPY biceps tendonesis possible rotator cuff tear & Labral tear, subacrominal decompression and distal clavicle excision (Left Shoulder)     Patient location during evaluation: PACU Anesthesia Type: General Level of consciousness: awake and alert and oriented Pain management: satisfactory to patient Vital Signs Assessment: post-procedure vital signs reviewed and stable Respiratory status: spontaneous breathing, nonlabored ventilation and respiratory function stable Cardiovascular status: blood pressure returned to baseline and stable Postop Assessment: Adequate PO intake and No signs of nausea or vomiting Anesthetic complications: no    Cherly Beach

## 2019-03-29 NOTE — Op Note (Signed)
03/29/2019  5:28 PM  PATIENT:  Jorge Phillips  49 y.o. male  PRE-OPERATIVE DIAGNOSIS: Left shoulder partial biceps tendon tear, partial subscapularis tear, possible labral tear, possible glenohumeral joint arthrosis  POST-OPERATIVE DIAGNOSIS:   1) left shoulder SLAP tear 2) glenohumeral joint arthrosis with full-thickness cartilage loss of the humeral head 3) anterior capsular strain with fraying of the anterior labrum 4) subacromial impingement with bursitis 5) acromioclavicular joint arthrosis  PROCEDURE:  Procedure(s): 1.  left shoulder arthroscopic anterior capsulorrhaphy  2.  Arthroscopic SLAP repair  3.  Chondroplasty of the glenohumeral joint  4.  Debridement of the synovitis of the left glenohumeral joint  5.  Subacromial decompression  6.  Distal clavicle excision   SURGEON:  Surgeon(s) and Role:    Thornton Park, MD - Primary  ANESTHESIA:   general and paracervical block   PREOPERATIVE INDICATIONS:  Jorge Phillips is a  49 y.o. male with a diagnosis of M75.122 Complete rotator cuff tear/rupture of left shoulder, not trauma S46.292D injection, muscle, fascia and prt tendon of biceps, left arm S43.432A superior glenoid labrum lesion, left shoulder, intial encounter who failed conservative measures and elected for surgical management.    I discussed the risks and benefits of surgery. The risks include but are not limited to infection, bleeding, nerve or blood vessel injury, joint stiffness or loss of motion, persistent pain, weakness or instability, and hardware failure and the need for further surgery. Medical risks include but are not limited to DVT and pulmonary embolism, myocardial infarction, stroke, pneumonia, respiratory failure and death. Patient understood these risks and wished to proceed.   OPERATIVE IMPLANTS: Arthrex bio suturetak anchors  4   OPERATIVE FINDINGS:  left shoulder SLAP tear, full-thickness chondral loss of the humeral head, glenoid chondral injury  with associated anterior labral fraying and capsular sprain, extensive synovitis of the glenohumeral joint, acromial impingement resulting in tensive bursitis and significant narrowing of the acromioclavicular joint  OPERATIVE PROCEDURE:  I met with the patient in the preoperative area.  I signed the left shoulder according the hospital's correct site of surgery protocol.  A history and physical was performed at the bedside.  Patient received an interscalene block with Exparel by the anesthesiologist.  Patient was brought to the operating room.  The patient underwent general anesthesia with LMA.  He was then positioned in a beachchair position. All bony prominences were adequately padded including the lower extremities.  Examination under anesthesia revealed no obvious instability with load and shift testing, and a negative sulcus sign testing respectively.  Patient had full passive range of motion left shoulder.    The patient was then prepped and draped in a sterile fashion. The patient received 2 g of Ancef IV prior to the onset of the case.  A timeout was performed to verify the patient's name, date of birth, medical record number, correct site of surgery and correct procedure to be performed.The timeout was also used to verify the patient received antibiotics that all appropriate instruments, implants and radiographic studies were available in the room. Once all in attendance were in agreement case began.  Bony landmarks were drawn out with a surgical marker along with proposed incisions.  An 11 blade was used to establish a posterior portal through which the arthroscope was placed in the glenohumeral joint. An anterior portal was established under direct visualization using an 18-gauge spinal needle for localization. A 5.75 mm arthroscopic cannula was inserted through the anterior portal. A full diagnostic examination of the glenohumeral  joint was performed.  Patient was found to have a SLAP tear  without evidence of a biceps tendon tear despite the suggestion of a biceps tendon injury on the patient's preoperative MRI.  The subscapularis tendon was also not found to be torn.  There is no medial subluxation of the biceps.  Patient had superior labral fraying debrided with a 4.0 mm resector shaver blade.  A 90 degree ArthroCare wand and 4.0 mm resector shaver blade were used to perform an synovectomy of the glenohumeral joint  The patient was found to have a large approximately 38m chondral lesion of the humeral head from the midpoint of the articular surface extending inferiorly.  There were loose chondral flaps at the periphery which were debrided with a 4.0 mm resector shaver blade.  A chondroplasty was performed but the decision was made not to perform a microfracture as the chondral lesion was not well shouldered.  There was significant chondral wear of the inferior half of the glenoid as well.  There was significant fraying of the anterior labrum without frank detachment.  The anterior inferior glenohumeral ligament appeared to be sprained and with increased laxity.  The decision was made to perform a capsulorrhaphy.  An anterolateral portal and a second anterior portal made inferior to the first were placed with 7 mm arthroscopic cannulas.  Two Arthrex bio suture tack anchors were placed into the anterior glenoid at approximately the 7:00 and 9:00 positions using a drill to insert the biosuture tac anchors..  A 90 degree suture lasso was used to shuttle a single limb of each of these anchors under the anterior labrum and through a tuck of the inferior glenohumeral ligament.  An arthroscopic knot tying technique was used to complete the capsulorrhaphy.  The attention was then turned to repairing the SLAP tear.  A bio suture tack anchor was placed anterior to the biceps anchor Arthrex Bio Suturetak anchor was then placed using a similar technique as noted above.  Again an arthroscopic knot tying  technique was used to approximate the labrum to the osseous glenoid.  An Arthrex percutaneous kit was used to then position the drill guide posterior to the biceps tendon from a lateral approach.  The second anchor was then inserted posterior to the biceps anchor.  Again a 90 degree suture lasso was used to shuttle a single limb of this anchor under the anterior posterior labrum.  An arthroscopic knot tying technique was used to complete the SLAP repair.  Final arthroscopic images of the anterior capsulorrhaphy and SLAP repair were taken.  The arthroscope was then placed in the subacromial space.  A lateral portal was created and a subacromial decompression with extensive debridement of bursitis was performed using a 90 degree ArthroCare wand and 5.5 mm resector shaver blade.  The patient was found to have significant narrowing between the distal clavicle and acromion.  Therefore through the anterior portal a distal clavicle excision was performed using the 5.5 mm resector shaver blade.  The subacromial space was then copiously lavaged.  All arthroscopic instruments were removed after final arthroscopic images were taken.  The five arthroscopic portals were closed with 4-0 nylon. A dry, sterile dressing was applied to the left shoulder, along with a Polar Care sleeve. Patient's left arm was then placed in an abduction sling.  He was awoken and brought to PACU in stable condition. I was scrubbed and present for the entire case and all sharp and instrument counts were correct at the conclusion the case.  I spoke with the patient's significant other by phone from the PACU to let her know the operation was performed without complication and that the patient was stable in the recovery room.  Postoperative instructions were reviewed with her on the phone.Timoteo Gaul, MD

## 2019-03-29 NOTE — H&P (Signed)
PREOPERATIVE H&P  Chief Complaint: M75.122 Complete rotator cuff tear/rupture of left shoulder, not trauma S46.292D injection, muscle, fascia and prt tendon of biceps, left arm S43.432A superior glenoid labrum lesion, left shoulder, intial encounter  HPI: Jorge Phillips is a 49 y.o. male who presents for preoperative history and physical with a diagnosis of M75.122 Complete rotator cuff tear/rupture of left shoulder, S43.432A superior glenoid labrum lesion, left shoulder, intial encounter. Symptoms of pain,limited ROM are significantly impairing activities of daily living.  He has agreed with surgical management after failing non-op management.  Past Medical History:  Diagnosis Date  . Hypertension    Past Surgical History:  Procedure Laterality Date  . MULTIPLE TOOTH EXTRACTIONS     Social History   Socioeconomic History  . Marital status: Single    Spouse name: Not on file  . Number of children: Not on file  . Years of education: Not on file  . Highest education level: Not on file  Occupational History  . Not on file  Tobacco Use  . Smoking status: Former Smoker    Packs/day: 1.00    Years: 12.00    Pack years: 12.00    Types: Cigarettes    Quit date: 2010    Years since quitting: 11.0  . Smokeless tobacco: Never Used  Substance and Sexual Activity  . Alcohol use: Yes    Alcohol/week: 4.0 standard drinks    Types: 4 Cans of beer per week  . Drug use: Not on file  . Sexual activity: Not on file  Other Topics Concern  . Not on file  Social History Narrative  . Not on file   Social Determinants of Health   Financial Resource Strain:   . Difficulty of Paying Living Expenses: Not on file  Food Insecurity:   . Worried About Charity fundraiser in the Last Year: Not on file  . Ran Out of Food in the Last Year: Not on file  Transportation Needs:   . Lack of Transportation (Medical): Not on file  . Lack of Transportation (Non-Medical): Not on file  Physical Activity:    . Days of Exercise per Week: Not on file  . Minutes of Exercise per Session: Not on file  Stress:   . Feeling of Stress : Not on file  Social Connections:   . Frequency of Communication with Friends and Family: Not on file  . Frequency of Social Gatherings with Friends and Family: Not on file  . Attends Religious Services: Not on file  . Active Member of Clubs or Organizations: Not on file  . Attends Archivist Meetings: Not on file  . Marital Status: Not on file   Family History  Problem Relation Age of Onset  . Cancer Mother        bladder  . Cancer Father    No Known Allergies Prior to Admission medications   Medication Sig Start Date End Date Taking? Authorizing Provider  amLODipine (NORVASC) 5 MG tablet Take 5 mg by mouth daily.   Yes [provider]  lisinopril (ZESTRIL) 20 MG tablet Take 20 mg by mouth daily.   Yes [provider]  meloxicam (MOBIC) 7.5 MG tablet Take 7.5 mg by mouth daily.   Yes [provider]     Positive ROS: All other systems have been reviewed and were otherwise negative with the exception of those mentioned in the HPI and as above.  Physical Exam: General: Alert, no acute distress Cardiovascular: Regular rate  and rhythm, no murmurs rubs or gallops.  No pedal edema Respiratory: Clear to auscultation bilaterally, no wheezes rales or rhonchi. No cyanosis, no use of accessory musculature GI: No organomegaly, abdomen is soft and non-tender nondistended with positive bowel sounds. Skin: Skin intact, no lesions within the operative field. Neurologic: Sensation intact distally Psychiatric: Patient is competent for consent with normal mood and affect Lymphatic: No cervical lymphadenopathy  MUSCULOSKELETAL: Left shoulder:   Pain with abduction and internal rotation.  Pain with Speed's Test.  Pain with o'brien's test.  NVI.  No rotator cuff weakness detected.   No instability.  + impingement  tests  Assessment: M75.122 Complete rotator cuff tear/rupture of left shoulder, not trauma S46.292D injection, muscle, fascia and prt tendon of biceps, left arm S43.432A superior glenoid labrum lesion, left shoulder, intial encounter  Plan: Plan for Procedure(s): left shoulder ARTHROSCOPY biceps tendonesis possible rotator cuff tear & Labral tear, subacrominal decompression and distal clavicle excision  I have reviewed the details of the operation and the post-op course with the patient.  I discussed the risks and benefits of surgery. The risks include but are not limited to infection, bleeding, nerve or blood vessel injury, joint stiffness or loss of motion, persistent pain, weakness or instability, retear of the biceps, labrum or rotator cuff and hardware failure and the need for further surgery. Medical risks include but are not limited to DVT and pulmonary embolism, myocardial infarction, stroke, pneumonia, respiratory failure and death. Patient understood these risks and wished to proceed.     Juanell Fairly, MD   03/29/2019 12:48 PM

## 2019-04-05 DIAGNOSIS — M25512 Pain in left shoulder: Secondary | ICD-10-CM | POA: Diagnosis not present

## 2019-04-09 DIAGNOSIS — S43432D Superior glenoid labrum lesion of left shoulder, subsequent encounter: Secondary | ICD-10-CM | POA: Diagnosis not present

## 2019-04-09 DIAGNOSIS — M75122 Complete rotator cuff tear or rupture of left shoulder, not specified as traumatic: Secondary | ICD-10-CM | POA: Diagnosis not present

## 2019-04-09 DIAGNOSIS — S46292D Other injury of muscle, fascia and tendon of other parts of biceps, left arm, subsequent encounter: Secondary | ICD-10-CM | POA: Diagnosis not present

## 2019-04-12 DIAGNOSIS — S43432D Superior glenoid labrum lesion of left shoulder, subsequent encounter: Secondary | ICD-10-CM | POA: Diagnosis not present

## 2019-04-12 DIAGNOSIS — M75122 Complete rotator cuff tear or rupture of left shoulder, not specified as traumatic: Secondary | ICD-10-CM | POA: Diagnosis not present

## 2019-04-12 DIAGNOSIS — S46292D Other injury of muscle, fascia and tendon of other parts of biceps, left arm, subsequent encounter: Secondary | ICD-10-CM | POA: Diagnosis not present

## 2019-04-15 DIAGNOSIS — S43432D Superior glenoid labrum lesion of left shoulder, subsequent encounter: Secondary | ICD-10-CM | POA: Diagnosis not present

## 2019-04-15 DIAGNOSIS — M75122 Complete rotator cuff tear or rupture of left shoulder, not specified as traumatic: Secondary | ICD-10-CM | POA: Diagnosis not present

## 2019-04-15 DIAGNOSIS — S46292D Other injury of muscle, fascia and tendon of other parts of biceps, left arm, subsequent encounter: Secondary | ICD-10-CM | POA: Diagnosis not present

## 2019-04-16 ENCOUNTER — Other Ambulatory Visit: Payer: Self-pay

## 2019-04-16 ENCOUNTER — Encounter: Payer: Self-pay | Admitting: Cardiovascular Disease

## 2019-04-16 ENCOUNTER — Ambulatory Visit (INDEPENDENT_AMBULATORY_CARE_PROVIDER_SITE_OTHER): Payer: BC Managed Care – PPO | Admitting: Cardiovascular Disease

## 2019-04-16 VITALS — BP 160/100 | HR 76 | Ht 75.0 in | Wt 222.8 lb

## 2019-04-16 DIAGNOSIS — I10 Essential (primary) hypertension: Secondary | ICD-10-CM | POA: Diagnosis not present

## 2019-04-16 MED ORDER — HYDROCHLOROTHIAZIDE 25 MG PO TABS: 25.0000 mg | ORAL_TABLET | Freq: Every day | ORAL | 3 refills | Status: DC

## 2019-04-16 NOTE — Patient Instructions (Addendum)
Medication Instructions:  Start HCTZ 25 mg daily (ok to start 1/2 pill for the first 1-2 weeks ) Stay on amlodipine and lisinopril for now  If you need a refill on your cardiac medications before your next appointment, please call your pharmacy.    Lab work: No new labs needed   If you have labs (blood work) drawn today and your tests are completely normal, you will receive your results only by: Marland Kitchen MyChart Message (if you have MyChart) OR . A paper copy in the mail If you have any lab test that is abnormal or we need to change your treatment, we will call you to review the results.   Testing/Procedures: No new testing needed   Follow-Up: At [facility], you and your health needs are our priority.  As part of our continuing mission to provide you with exceptional heart care, we have created designated Provider Care Teams.  These Care Teams include your primary Cardiologist (physician) and Advanced Practice Providers (APPs -  Physician Assistants and Nurse Practitioners) who all work together to provide you with the care you need, when you need it.  . You will need a follow up appointment as needed . Call with blood pressure numbers . Make sure to check blood pressures 2 hours after medications.    . Providers on your designated Care Team:   . Murray Hodgkins, NP . Christell Faith, PA-C . Marrianne Mood, PA-C  Any Other Special Instructions Will Be Listed Below (If Applicable).  For educational health videos Log in to : www.myemmi.com Or : [email or web address], password : [email or web address]   How to Take Your Blood Pressure You can take your blood pressure at home with a machine. You may need to check your blood pressure at home:  To check if you have high blood pressure (hypertension).  To check your blood pressure over time.  To make sure your blood pressure medicine is working. Supplies needed: You will need a blood pressure machine, or monitor. You can buy one at a drugstore or  online. When choosing one:  Choose one with an arm cuff.  Choose one that wraps around your upper arm. Only one finger should fit between your arm and the cuff.  Do not choose one that measures your blood pressure from your wrist or finger. Your doctor can suggest a monitor. How to prepare Avoid these things for 30 minutes before checking your blood pressure:  Drinking caffeine.  Drinking alcohol.  Eating.  Smoking.  Exercising. Five minutes before checking your blood pressure:  Pee.  Sit in a dining chair. Avoid sitting in a soft couch or armchair.  Be quiet. Do not talk. How to take your blood pressure Follow the instructions that came with your machine. If you have a digital blood pressure monitor, these may be the instructions: 1. Sit up straight. 2. Place your feet on the floor. Do not cross your ankles or legs. 3. Rest your left arm at the level of your heart. You may rest it on a table, desk, or chair. 4. Pull up your shirt sleeve. 5. Wrap the blood pressure cuff around the upper part of your left arm. The cuff should be 1 inch (2.5 cm) above your elbow. It is best to wrap the cuff around bare skin. 6. Fit the cuff snugly around your arm. You should be able to place only one finger between the cuff and your arm. 7. Put the cord inside the groove of your elbow. 8. Press the  power button. 9. Sit quietly while the cuff fills with air and loses air. 10. Write down the numbers on the screen. 11. Wait 2-3 minutes and then repeat steps 1-10. What do the numbers mean? Two numbers make up your blood pressure. The first number is called systolic pressure. The second is called diastolic pressure. An example of a blood pressure reading is "120 over 80" (or 120/80). If you are an adult and do not have a medical condition, use this guide to find out if your blood pressure is normal: Normal  First number: below 120.  Second number: below 80. Elevated  First number:  120-129.  Second number: below 80. Hypertension stage 1  First number: 130-139.  Second number: 80-89. Hypertension stage 2  First number: 140 or above.  Second number: 90 or above. Your blood pressure is above normal even if only the top or bottom number is above normal. Follow these instructions at home:  Check your blood pressure as often as your doctor tells you to.  Take your monitor to your next doctor's appointment. Your doctor will: ? Make sure you are using it correctly. ? Make sure it is working right.  Make sure you understand what your blood pressure numbers should be.  Tell your doctor if your medicines are causing side effects. Contact a doctor if:  Your blood pressure keeps being high. Get help right away if:  Your first blood pressure number is higher than 180.  Your second blood pressure number is higher than 120. This information is not intended to replace advice given to you by your health care provider. Make sure you discuss any questions you have with your health care provider. Document Revised: 01/27/2017 Document Reviewed: 07/24/2015 Elsevier Patient Education  2020 [employer].

## 2019-04-16 NOTE — Progress Notes (Signed)
Cardiology Office Note  Date:  04/16/2019   ID:  Jorge Phillips, DOB 11-18-1970, MRN [id number]  PCP:  Margaretann Loveless, PA-C   Chief Complaint  Patient presents with  . New Patient (Initial Visit)    HTN: Meds verbally reviewed with patient.    HPI:  Mr. Jorge Phillips is a 49 year old gentleman with past medical history of Hypertension Rotator cuff tear/injury of biceps, surgery Smoked 11 years, , quit Referred by Eliezer Bottom for consultation of his hypertension  Reports he did not know he had blood pressure issues until he was going in for shoulder surgery  Elevated blood pressure when seen by primary care August 09, 2018  159/101  Has home cuff, has been checking his numbers High blood pressure during surgery Had swelling on amlodipine 10 daily Better swelling on 5 mg daily Denies any significant symptoms on lisinopril, though does have some coughing  Take BP meds before bed Home numbers: 140s-150s/78-90 Numbers may have lower before surgery  Lab work reviewed with him HAB1c 4.4 LDL 62 Potassium 3.7, sodium 138  EKG personally reviewed by myself on todays visit Shows normal sinus rhythm rate 76 bpm no significant ST or T wave changes  Father passed away:  Cancer, age 49 Sister with HTN Mom with diabetes  PMH:   has a past medical history of Hypertension.  PSH:    Past Surgical History:  Procedure Laterality Date  . MULTIPLE TOOTH EXTRACTIONS    . SHOULDER ARTHROSCOPY WITH ROTATOR CUFF REPAIR AND OPEN BICEPS TENODESIS Left 03/29/2019   Procedure: left shoulder ARTHROSCOPY biceps tendonesis possible rotator cuff tear & Labral tear, subacrominal decompression and distal clavicle excision;  Surgeon: Juanell Fairly, MD;  [facility];  Service: Orthopedics;  Laterality: Left;    Current Outpatient Medications  Medication Sig Dispense Refill  . amLODipine (NORVASC) 5 MG tablet Take 5 mg by mouth daily.    Marland Kitchen lisinopril (ZESTRIL) 20 MG tablet  Take 20 mg by mouth daily.    . ondansetron (ZOFRAN) 4 MG tablet Take 1 tablet (4 mg total) by mouth every 8 (eight) hours as needed for nausea or vomiting. 30 tablet 0  . oxyCODONE (OXY IR/ROXICODONE) 5 MG immediate release tablet Take 1 tablet (5 mg total) by mouth every 4 (four) hours as needed for severe pain. (Patient not taking: Reported on 04/16/2019) 40 tablet 0   No current facility-administered medications for this visit.     Allergies:   Patient has no known allergies.   Social History:  The patient  reports that he quit smoking about 11 years ago. His smoking use included cigarettes. He has a 12.00 pack-year smoking history. He has never used smokeless tobacco. He reports current alcohol use of about 1.0 standard drinks of alcohol per week. He reports previous drug use.   Family History:   family history includes Cancer in his father and mother; Diabetes in his mother.    Review of Systems: Review of Systems  Constitutional: Negative.   HENT: Negative.   Respiratory: Negative.   Cardiovascular: Negative.   Gastrointestinal: Negative.   Musculoskeletal: Negative.   Neurological: Negative.   Psychiatric/Behavioral: Negative.   All other systems reviewed and are negative.   PHYSICAL EXAM: VS:  BP (!) 160/100 (BP Location: Right Arm, Patient Position: Sitting, Cuff Size: Normal)   Pulse 76   Ht 6\' 3"  (1.905 m)   Wt 222 lb 12 oz (101 kg)   SpO2 99%   BMI 27.84 kg/m  ,  BMI Body mass index is 27.84 kg/m. GEN: Well nourished, well developed, in no acute distress HEENT: normal Neck: no JVD, carotid bruits, or masses Cardiac: RRR; no murmurs, rubs, or gallops,no edema  Respiratory:  clear to auscultation bilaterally, normal work of breathing GI: soft, nontender, nondistended, + BS MS: no deformity or atrophy Left shoulder in a sling Skin: warm and dry, no rash Neuro:  Strength and sensation are intact Psych: euthymic mood, full affect   Recent Labs: 08/15/2018: ALT  50; BUN 10; Creatinine, Ser 1.00; Hemoglobin 13.1; Platelets 185; Potassium 3.7; Sodium 138; TSH 1.540    Lipid Panel Lab Results  Component Value Date   CHOL 153 08/15/2018   HDL 33 (L) 08/15/2018   LDLCALC 62 08/15/2018   TRIG 291 (H) 08/15/2018      Wt Readings from Last 3 Encounters:  04/16/19 222 lb 12 oz (101 kg)  03/29/19 224 lb (101.6 kg)  09/27/18 224 lb 9.6 oz (101.9 kg)       ASSESSMENT AND PLAN:  Problem List Items Addressed This Visit    None    Visit Diagnoses    Hypertension, unspecified type    -  Primary   Relevant Orders   EKG 12-Lead     Long discussion with him concerning various treatment options He does report family history of hypertension, grandparents, sister Sister is on at least 2 medications -He did not tolerate higher dose amlodipine, he does report some cough on the ACE inhibitor --Recommended he try HCTZ 12.5 mg daily titrating up to 25 mg daily in the next several weeks If tolerated and improved numbers, would like to consolidate his medications with amlodipine/valsartan/HCTZ triple combo pill --He will call us back or leave my chart messages with blood pressure measurements -Recommended he not check his blood pressure when he is having PT you are having shoulder pain --Stressed importance of maintaining low weight --Hold off on secondary causes of hypertension at this time given strong family history but we did discuss additional work-up might be indicated for resistant hypertension  Cardiac risk factor/preventive care Cholesterol is excellent level on no medications Prior smoking history 11 years, quit No diabetes No further work-up needed  Disposition:   F/U  12 months   Total encounter time more than 60 minutes  Greater than 50% was spent in counseling and coordination of care with the patient  Patient seen in consultation for Lillia Abed will be referred back to her office for ongoing care of the issues detailed  above    Signed, Esmond Plants, M.D., Ph.D. [place], [place]

## 2019-04-19 DIAGNOSIS — S46292D Other injury of muscle, fascia and tendon of other parts of biceps, left arm, subsequent encounter: Secondary | ICD-10-CM | POA: Diagnosis not present

## 2019-04-19 DIAGNOSIS — M75122 Complete rotator cuff tear or rupture of left shoulder, not specified as traumatic: Secondary | ICD-10-CM | POA: Diagnosis not present

## 2019-04-19 DIAGNOSIS — S43432D Superior glenoid labrum lesion of left shoulder, subsequent encounter: Secondary | ICD-10-CM | POA: Diagnosis not present

## 2019-04-23 DIAGNOSIS — M75122 Complete rotator cuff tear or rupture of left shoulder, not specified as traumatic: Secondary | ICD-10-CM | POA: Diagnosis not present

## 2019-04-23 DIAGNOSIS — S43432D Superior glenoid labrum lesion of left shoulder, subsequent encounter: Secondary | ICD-10-CM | POA: Diagnosis not present

## 2019-04-23 DIAGNOSIS — S46292D Other injury of muscle, fascia and tendon of other parts of biceps, left arm, subsequent encounter: Secondary | ICD-10-CM | POA: Diagnosis not present

## 2019-04-26 DIAGNOSIS — S43432D Superior glenoid labrum lesion of left shoulder, subsequent encounter: Secondary | ICD-10-CM | POA: Diagnosis not present

## 2019-04-26 DIAGNOSIS — M75122 Complete rotator cuff tear or rupture of left shoulder, not specified as traumatic: Secondary | ICD-10-CM | POA: Diagnosis not present

## 2019-04-26 DIAGNOSIS — S46292D Other injury of muscle, fascia and tendon of other parts of biceps, left arm, subsequent encounter: Secondary | ICD-10-CM | POA: Diagnosis not present

## 2019-04-30 DIAGNOSIS — M75122 Complete rotator cuff tear or rupture of left shoulder, not specified as traumatic: Secondary | ICD-10-CM | POA: Diagnosis not present

## 2019-04-30 DIAGNOSIS — S46292D Other injury of muscle, fascia and tendon of other parts of biceps, left arm, subsequent encounter: Secondary | ICD-10-CM | POA: Diagnosis not present

## 2019-04-30 DIAGNOSIS — S43432D Superior glenoid labrum lesion of left shoulder, subsequent encounter: Secondary | ICD-10-CM | POA: Diagnosis not present

## 2019-05-08 DIAGNOSIS — M75122 Complete rotator cuff tear or rupture of left shoulder, not specified as traumatic: Secondary | ICD-10-CM | POA: Diagnosis not present

## 2019-05-08 DIAGNOSIS — S43432D Superior glenoid labrum lesion of left shoulder, subsequent encounter: Secondary | ICD-10-CM | POA: Diagnosis not present

## 2019-05-08 DIAGNOSIS — S46292D Other injury of muscle, fascia and tendon of other parts of biceps, left arm, subsequent encounter: Secondary | ICD-10-CM | POA: Diagnosis not present

## 2019-05-15 DIAGNOSIS — S46292D Other injury of muscle, fascia and tendon of other parts of biceps, left arm, subsequent encounter: Secondary | ICD-10-CM | POA: Diagnosis not present

## 2019-05-15 DIAGNOSIS — M75122 Complete rotator cuff tear or rupture of left shoulder, not specified as traumatic: Secondary | ICD-10-CM | POA: Diagnosis not present

## 2019-05-15 DIAGNOSIS — S43432D Superior glenoid labrum lesion of left shoulder, subsequent encounter: Secondary | ICD-10-CM | POA: Diagnosis not present

## 2019-05-17 DIAGNOSIS — S43432D Superior glenoid labrum lesion of left shoulder, subsequent encounter: Secondary | ICD-10-CM | POA: Diagnosis not present

## 2019-05-17 DIAGNOSIS — S46292D Other injury of muscle, fascia and tendon of other parts of biceps, left arm, subsequent encounter: Secondary | ICD-10-CM | POA: Diagnosis not present

## 2019-05-17 DIAGNOSIS — M75122 Complete rotator cuff tear or rupture of left shoulder, not specified as traumatic: Secondary | ICD-10-CM | POA: Diagnosis not present

## 2019-05-21 DIAGNOSIS — S43432D Superior glenoid labrum lesion of left shoulder, subsequent encounter: Secondary | ICD-10-CM | POA: Diagnosis not present

## 2019-05-21 DIAGNOSIS — M75122 Complete rotator cuff tear or rupture of left shoulder, not specified as traumatic: Secondary | ICD-10-CM | POA: Diagnosis not present

## 2019-05-21 DIAGNOSIS — S46292D Other injury of muscle, fascia and tendon of other parts of biceps, left arm, subsequent encounter: Secondary | ICD-10-CM | POA: Diagnosis not present

## 2019-05-23 DIAGNOSIS — S43432D Superior glenoid labrum lesion of left shoulder, subsequent encounter: Secondary | ICD-10-CM | POA: Diagnosis not present

## 2019-05-23 DIAGNOSIS — M75122 Complete rotator cuff tear or rupture of left shoulder, not specified as traumatic: Secondary | ICD-10-CM | POA: Diagnosis not present

## 2019-05-23 DIAGNOSIS — S46292D Other injury of muscle, fascia and tendon of other parts of biceps, left arm, subsequent encounter: Secondary | ICD-10-CM | POA: Diagnosis not present

## 2019-05-27 DIAGNOSIS — S43432D Superior glenoid labrum lesion of left shoulder, subsequent encounter: Secondary | ICD-10-CM | POA: Diagnosis not present

## 2019-05-27 DIAGNOSIS — S46292D Other injury of muscle, fascia and tendon of other parts of biceps, left arm, subsequent encounter: Secondary | ICD-10-CM | POA: Diagnosis not present

## 2019-05-27 DIAGNOSIS — M75122 Complete rotator cuff tear or rupture of left shoulder, not specified as traumatic: Secondary | ICD-10-CM | POA: Diagnosis not present

## 2019-05-30 DIAGNOSIS — S43432D Superior glenoid labrum lesion of left shoulder, subsequent encounter: Secondary | ICD-10-CM | POA: Diagnosis not present

## 2019-05-30 DIAGNOSIS — M75122 Complete rotator cuff tear or rupture of left shoulder, not specified as traumatic: Secondary | ICD-10-CM | POA: Diagnosis not present

## 2019-05-30 DIAGNOSIS — S46292D Other injury of muscle, fascia and tendon of other parts of biceps, left arm, subsequent encounter: Secondary | ICD-10-CM | POA: Diagnosis not present

## 2019-06-04 DIAGNOSIS — S43432D Superior glenoid labrum lesion of left shoulder, subsequent encounter: Secondary | ICD-10-CM | POA: Diagnosis not present

## 2019-06-04 DIAGNOSIS — M75122 Complete rotator cuff tear or rupture of left shoulder, not specified as traumatic: Secondary | ICD-10-CM | POA: Diagnosis not present

## 2019-06-04 DIAGNOSIS — S46292D Other injury of muscle, fascia and tendon of other parts of biceps, left arm, subsequent encounter: Secondary | ICD-10-CM | POA: Diagnosis not present

## 2019-06-06 DIAGNOSIS — S43432D Superior glenoid labrum lesion of left shoulder, subsequent encounter: Secondary | ICD-10-CM | POA: Diagnosis not present

## 2019-06-06 DIAGNOSIS — S46292D Other injury of muscle, fascia and tendon of other parts of biceps, left arm, subsequent encounter: Secondary | ICD-10-CM | POA: Diagnosis not present

## 2019-06-06 DIAGNOSIS — M75122 Complete rotator cuff tear or rupture of left shoulder, not specified as traumatic: Secondary | ICD-10-CM | POA: Diagnosis not present

## 2019-06-11 DIAGNOSIS — S46292D Other injury of muscle, fascia and tendon of other parts of biceps, left arm, subsequent encounter: Secondary | ICD-10-CM | POA: Diagnosis not present

## 2019-06-11 DIAGNOSIS — M75122 Complete rotator cuff tear or rupture of left shoulder, not specified as traumatic: Secondary | ICD-10-CM | POA: Diagnosis not present

## 2019-06-11 DIAGNOSIS — S43432D Superior glenoid labrum lesion of left shoulder, subsequent encounter: Secondary | ICD-10-CM | POA: Diagnosis not present

## 2019-06-13 DIAGNOSIS — S46292D Other injury of muscle, fascia and tendon of other parts of biceps, left arm, subsequent encounter: Secondary | ICD-10-CM | POA: Diagnosis not present

## 2019-06-13 DIAGNOSIS — S43432D Superior glenoid labrum lesion of left shoulder, subsequent encounter: Secondary | ICD-10-CM | POA: Diagnosis not present

## 2019-06-13 DIAGNOSIS — M75122 Complete rotator cuff tear or rupture of left shoulder, not specified as traumatic: Secondary | ICD-10-CM | POA: Diagnosis not present

## 2019-06-17 DIAGNOSIS — S46292D Other injury of muscle, fascia and tendon of other parts of biceps, left arm, subsequent encounter: Secondary | ICD-10-CM | POA: Diagnosis not present

## 2019-06-17 DIAGNOSIS — M75122 Complete rotator cuff tear or rupture of left shoulder, not specified as traumatic: Secondary | ICD-10-CM | POA: Diagnosis not present

## 2019-06-17 DIAGNOSIS — S43432D Superior glenoid labrum lesion of left shoulder, subsequent encounter: Secondary | ICD-10-CM | POA: Diagnosis not present

## 2019-06-19 DIAGNOSIS — S46292D Other injury of muscle, fascia and tendon of other parts of biceps, left arm, subsequent encounter: Secondary | ICD-10-CM | POA: Diagnosis not present

## 2019-06-19 DIAGNOSIS — M75122 Complete rotator cuff tear or rupture of left shoulder, not specified as traumatic: Secondary | ICD-10-CM | POA: Diagnosis not present

## 2019-06-19 DIAGNOSIS — S43432D Superior glenoid labrum lesion of left shoulder, subsequent encounter: Secondary | ICD-10-CM | POA: Diagnosis not present

## 2019-06-23 ENCOUNTER — Other Ambulatory Visit: Payer: Self-pay | Admitting: Cardiovascular Disease

## 2019-06-23 MED ORDER — AMLODIPINE-VALSARTAN-HCTZ 5-160-25 MG PO TABS: 1.0000 | ORAL_TABLET | Freq: Every day | ORAL | 3 refills | Status: DC

## 2019-06-25 DIAGNOSIS — M75122 Complete rotator cuff tear or rupture of left shoulder, not specified as traumatic: Secondary | ICD-10-CM | POA: Diagnosis not present

## 2019-06-25 DIAGNOSIS — S46292D Other injury of muscle, fascia and tendon of other parts of biceps, left arm, subsequent encounter: Secondary | ICD-10-CM | POA: Diagnosis not present

## 2019-06-25 DIAGNOSIS — S43432D Superior glenoid labrum lesion of left shoulder, subsequent encounter: Secondary | ICD-10-CM | POA: Diagnosis not present

## 2019-06-27 DIAGNOSIS — M75122 Complete rotator cuff tear or rupture of left shoulder, not specified as traumatic: Secondary | ICD-10-CM | POA: Diagnosis not present

## 2019-06-27 DIAGNOSIS — S46292D Other injury of muscle, fascia and tendon of other parts of biceps, left arm, subsequent encounter: Secondary | ICD-10-CM | POA: Diagnosis not present

## 2019-06-27 DIAGNOSIS — S43432D Superior glenoid labrum lesion of left shoulder, subsequent encounter: Secondary | ICD-10-CM | POA: Diagnosis not present

## 2019-07-02 DIAGNOSIS — S46292D Other injury of muscle, fascia and tendon of other parts of biceps, left arm, subsequent encounter: Secondary | ICD-10-CM | POA: Diagnosis not present

## 2019-07-02 DIAGNOSIS — M75122 Complete rotator cuff tear or rupture of left shoulder, not specified as traumatic: Secondary | ICD-10-CM | POA: Diagnosis not present

## 2019-07-02 DIAGNOSIS — S43432D Superior glenoid labrum lesion of left shoulder, subsequent encounter: Secondary | ICD-10-CM | POA: Diagnosis not present

## 2019-07-03 ENCOUNTER — Other Ambulatory Visit: Payer: Self-pay | Admitting: *Deleted

## 2019-07-03 ENCOUNTER — Telehealth: Payer: Self-pay | Admitting: Cardiovascular Disease

## 2019-07-03 MED ORDER — AMLODIPINE BESYLATE 5 MG PO TABS: 5.0000 mg | ORAL_TABLET | Freq: Every day | ORAL | 3 refills | Status: DC

## 2019-07-03 MED ORDER — VALSARTAN-HYDROCHLOROTHIAZIDE 160-25 MG PO TABS: 1.0000 | ORAL_TABLET | Freq: Every day | ORAL | 3 refills | Status: DC

## 2019-07-03 NOTE — Telephone Encounter (Signed)
Spoke with patient and reviewed options that Dr. Mariah Milling had recommended for combination pill. Olmesartan-Amlodipine-HCTZ and also Valsartan-HCTZ with amlodipine as 2 pills verses one. Patient would like to stay on same medication and dose given that this has been working for him and he doesn't want to change since he is going out of town. Sent in Valsartan-HCTZ 160-25 mg once daily and Amlodipine 5 mg once daily. He was appreciative for the call back with no further questions at this time. Instructed him to please call if he should have any problems with getting these filled. He verbalized understanding and no further concerns.

## 2019-07-03 NOTE — Telephone Encounter (Signed)
Left voicemail message for patient to call back. Spoke with Dr. Mariah Milling regarding availability of medication and options which we could try. Will await patient to call back.

## 2019-07-04 DIAGNOSIS — M75122 Complete rotator cuff tear or rupture of left shoulder, not specified as traumatic: Secondary | ICD-10-CM | POA: Diagnosis not present

## 2019-07-04 DIAGNOSIS — S46292D Other injury of muscle, fascia and tendon of other parts of biceps, left arm, subsequent encounter: Secondary | ICD-10-CM | POA: Diagnosis not present

## 2019-07-04 DIAGNOSIS — S43432D Superior glenoid labrum lesion of left shoulder, subsequent encounter: Secondary | ICD-10-CM | POA: Diagnosis not present

## 2019-07-10 DIAGNOSIS — M75122 Complete rotator cuff tear or rupture of left shoulder, not specified as traumatic: Secondary | ICD-10-CM | POA: Diagnosis not present

## 2019-07-10 DIAGNOSIS — S46292D Other injury of muscle, fascia and tendon of other parts of biceps, left arm, subsequent encounter: Secondary | ICD-10-CM | POA: Diagnosis not present

## 2019-07-10 DIAGNOSIS — S43432D Superior glenoid labrum lesion of left shoulder, subsequent encounter: Secondary | ICD-10-CM | POA: Diagnosis not present

## 2019-07-16 DIAGNOSIS — S46292D Other injury of muscle, fascia and tendon of other parts of biceps, left arm, subsequent encounter: Secondary | ICD-10-CM | POA: Diagnosis not present

## 2019-07-16 DIAGNOSIS — M75122 Complete rotator cuff tear or rupture of left shoulder, not specified as traumatic: Secondary | ICD-10-CM | POA: Diagnosis not present

## 2019-07-16 DIAGNOSIS — S43432D Superior glenoid labrum lesion of left shoulder, subsequent encounter: Secondary | ICD-10-CM | POA: Diagnosis not present

## 2019-07-23 DIAGNOSIS — S46292D Other injury of muscle, fascia and tendon of other parts of biceps, left arm, subsequent encounter: Secondary | ICD-10-CM | POA: Diagnosis not present

## 2019-07-23 DIAGNOSIS — M75122 Complete rotator cuff tear or rupture of left shoulder, not specified as traumatic: Secondary | ICD-10-CM | POA: Diagnosis not present

## 2019-07-23 DIAGNOSIS — S43432D Superior glenoid labrum lesion of left shoulder, subsequent encounter: Secondary | ICD-10-CM | POA: Diagnosis not present

## 2019-07-25 DIAGNOSIS — H5789 Other specified disorders of eye and adnexa: Secondary | ICD-10-CM | POA: Diagnosis not present

## 2019-07-31 DIAGNOSIS — Z9889 Other specified postprocedural states: Secondary | ICD-10-CM | POA: Diagnosis not present

## 2019-08-22 DIAGNOSIS — M75122 Complete rotator cuff tear or rupture of left shoulder, not specified as traumatic: Secondary | ICD-10-CM | POA: Diagnosis not present

## 2019-08-22 DIAGNOSIS — S46292D Other injury of muscle, fascia and tendon of other parts of biceps, left arm, subsequent encounter: Secondary | ICD-10-CM | POA: Diagnosis not present

## 2019-08-22 DIAGNOSIS — S43432D Superior glenoid labrum lesion of left shoulder, subsequent encounter: Secondary | ICD-10-CM | POA: Diagnosis not present

## 2019-09-11 DIAGNOSIS — M75122 Complete rotator cuff tear or rupture of left shoulder, not specified as traumatic: Secondary | ICD-10-CM | POA: Diagnosis not present

## 2019-11-01 DIAGNOSIS — Z23 Encounter for immunization: Secondary | ICD-10-CM | POA: Diagnosis not present

## 2020-03-25 ENCOUNTER — Other Ambulatory Visit: Payer: Self-pay | Admitting: Cardiovascular Disease

## 2020-07-02 DIAGNOSIS — U071 COVID-19: Secondary | ICD-10-CM | POA: Diagnosis not present

## 2020-07-16 DIAGNOSIS — Z1389 Encounter for screening for other disorder: Secondary | ICD-10-CM | POA: Diagnosis not present

## 2020-08-07 ENCOUNTER — Other Ambulatory Visit: Payer: Self-pay | Admitting: Cardiovascular Disease

## 2020-08-10 NOTE — Telephone Encounter (Signed)
Attempted to schedule.  Number disconnected.  Mailing letter. Closing enounter.

## 2020-08-10 NOTE — Telephone Encounter (Signed)
Please schedule office visit for refills or have patient get from PCP if he declines F/U. Thank you!

## 2020-08-12 ENCOUNTER — Other Ambulatory Visit: Payer: Self-pay | Admitting: Cardiovascular Disease

## 2020-08-13 ENCOUNTER — Telehealth: Payer: Self-pay | Admitting: Cardiovascular Disease

## 2020-08-13 NOTE — Telephone Encounter (Signed)
*  STAT* If patient is at the pharmacy, call can be transferred to refill team.   1. Which medications need to be refilled? (please list name of each medication and dose if known)  amlodipine 5 mg po q d   2. Which pharmacy/location (including street and city if local pharmacy) is medication to be sent to? [facility] [address]  3. Do they need a 30 day or 90 day supply? 90  Patient moving to [place] and asking for enough supply to get established with a new provider.

## 2020-08-13 NOTE — Telephone Encounter (Signed)
Please advise if OK to refill or defer to PCP. Last seen 04/2019. Multiple attempts made to schedule F/U office visit. Patient called stating he is moving to [place] and needs a 90 day supply of medication until he gets new provider when he gets there. (See below) Thank you!

## 2020-08-14 MED ORDER — AMLODIPINE BESYLATE 5 MG PO TABS: 5.0000 mg | ORAL_TABLET | Freq: Every day | ORAL | 0 refills | Status: DC

## 2020-08-14 NOTE — Telephone Encounter (Signed)
Requested Prescriptions   Signed Prescriptions Disp Refills   amLODipine (NORVASC) 5 MG tablet 30 tablet 0    Sig: Take 1 tablet (5 mg total) by mouth daily.    Authorizing Provider: Antonieta Iba    Ordering User: Thayer Headings, Josearmando Kuhnert L

## 2020-09-08 ENCOUNTER — Other Ambulatory Visit: Payer: Self-pay | Admitting: Cardiovascular Disease

## 2020-09-15 ENCOUNTER — Telehealth: Payer: Self-pay | Admitting: Cardiovascular Disease

## 2020-09-15 NOTE — Telephone Encounter (Signed)
-----   Message from Loman Chroman sent at 09/08/2020  1:49 PM EDT ----- Regarding: yearly f/u LVM for patient to callback ----- Message ----- From: Festus Aloe, CMA Sent: 09/08/2020  12:20 PM EDT To: Mickie Bail Burl Scheduling  Please contact patient for a follow up appointment.  The patient was told to follow up as needed but is required to be seen in follow up at least once a year to keep refilling medications.  Thanks, Jasmine December

## 2020-09-15 NOTE — Telephone Encounter (Signed)
Patient has moved out of state and will no longer need our service

## 2020-09-22 IMAGING — MR MR SHOULDER*L* W/ CM
6 series · 40 of 40 positions shown · IV contrast (agent unspecified)
Comparison: None.

CLINICAL DATA: Left shoulder pain, limited range of motion

EXAM:
MR ARTHROGRAM OF THE LEFT SHOULDER
TECHNIQUE: Multiplanar, multisequence MR imaging of the left shoulder was
performed following the administration of intra-articular contrast.
CONTRAST:  See Injection Documentation.

[Series 5: T1 fat-sat · axial · left · 4.0mm · 0.55mm/px · z∈[-57,+62]mm · 6 of 25 slices shown (1 of 3)]
[im 1/25]
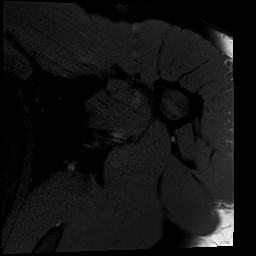
[im 5/25]
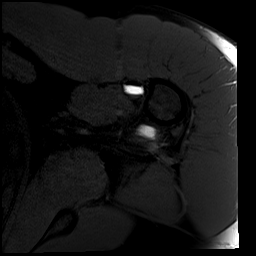
[im 10/25]
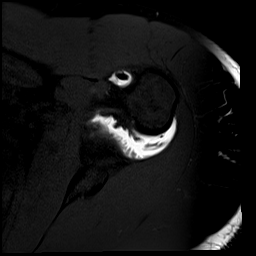
[im 15/25]
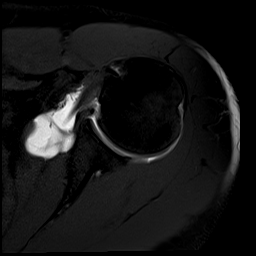
[im 20/25]
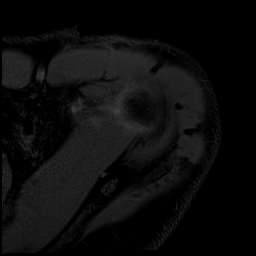
[im 25/25]
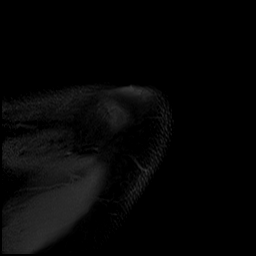

[Series 6: T1 fat-sat · oblique · left · 4.0mm · 0.55mm/px · 6 of 25 slices shown (2 of 3)]
[im 1/25]
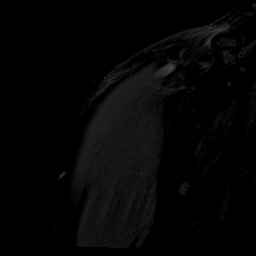
[im 5/25]
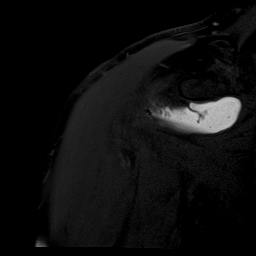
[im 10/25]
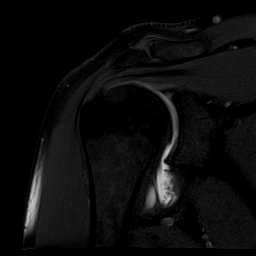
[im 15/25]
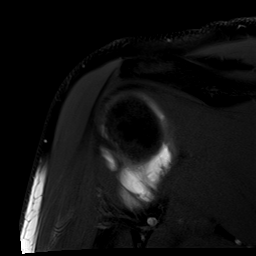
[im 20/25]
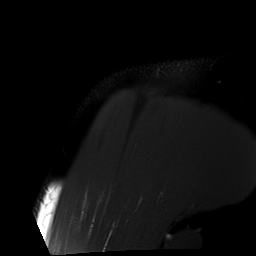
[im 25/25]
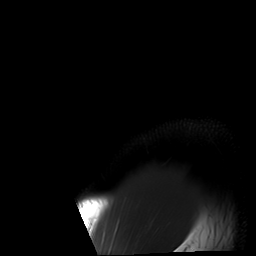

[Series 7: T2 fat-sat · oblique · left · 4.0mm · 0.55mm/px · 7 of 26 slices shown (1 of 2)]
[im 1/26]
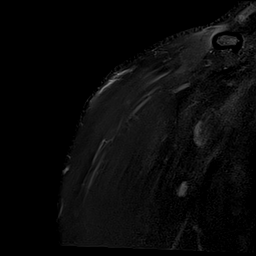
[im 5/26]
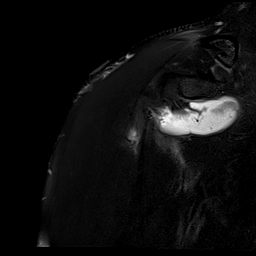
[im 9/26]
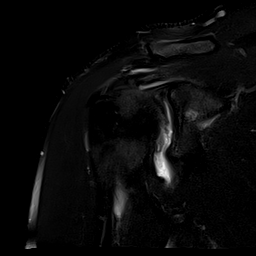
[im 13/26]
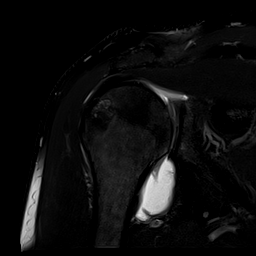
[im 17/26]
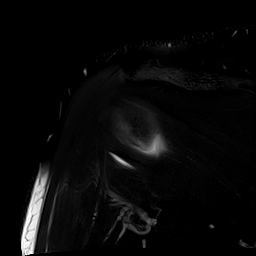
[im 21/26]
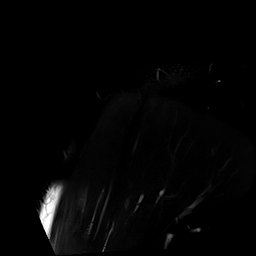
[im 26/26]
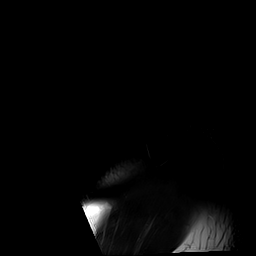

[Series 8: T1 · oblique · left · 4.0mm · 0.51mm/px · 7 of 26 slices shown]
[im 1/26]
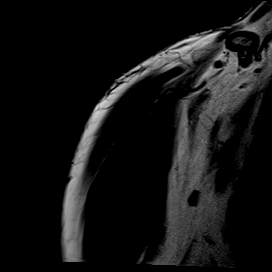
[im 5/26]
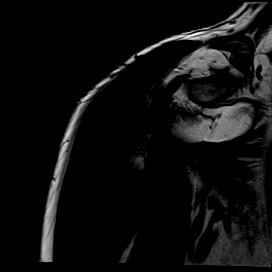
[im 9/26]
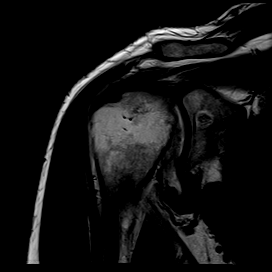
[im 13/26]
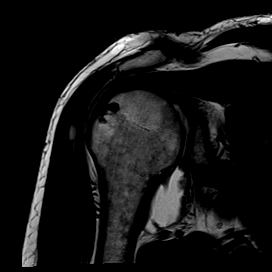
[im 17/26]
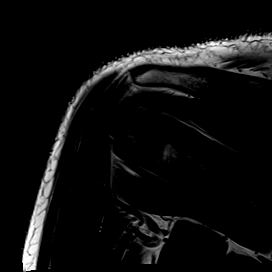
[im 21/26]
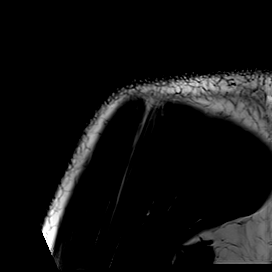
[im 26/26]
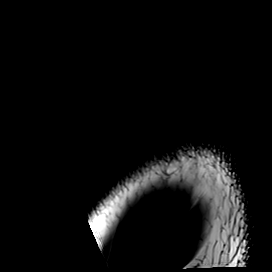

[Series 9: T2 fat-sat · oblique · left · 4.0mm · 0.55mm/px · 7 of 25 slices shown (2 of 2)]
[im 1/25]
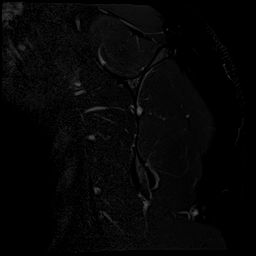
[im 5/25]
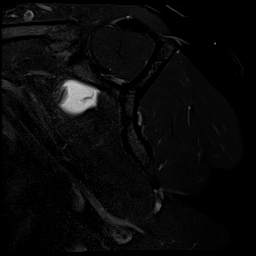
[im 9/25]
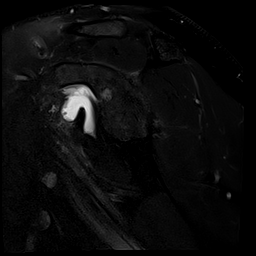
[im 13/25]
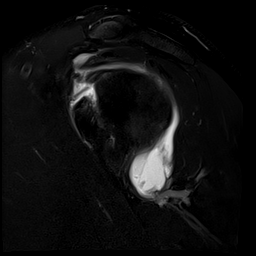
[im 17/25]
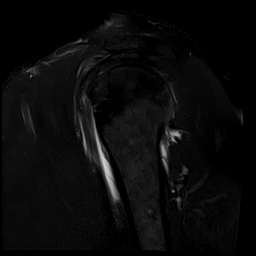
[im 21/25]
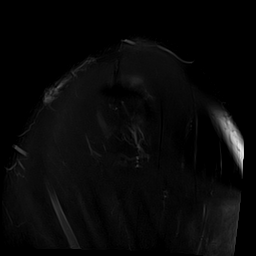
[im 25/25]
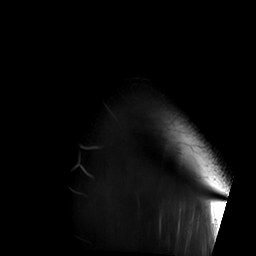

[Series 12: T1 fat-sat · sagittal · left · 4.0mm · 0.62mm/px · 7 of 26 slices shown (3 of 3)]
[im 1/26]
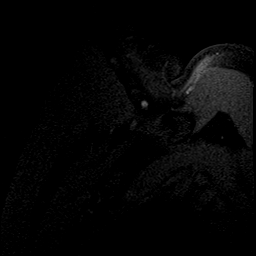
[im 5/26]
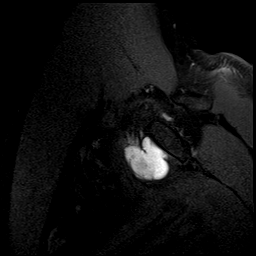
[im 9/26]
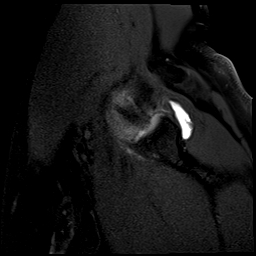
[im 13/26]
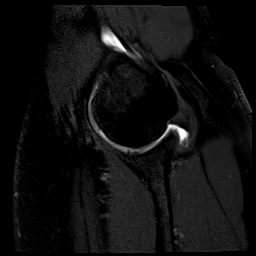
[im 17/26]
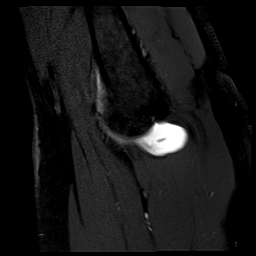
[im 21/26]
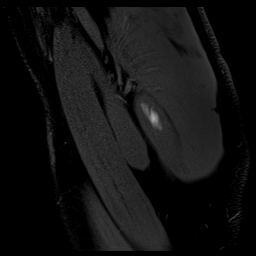
[im 26/26]
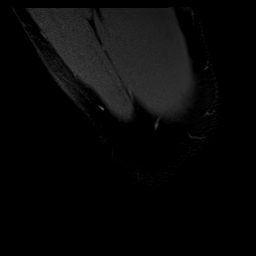

[40 of 40 positions shown; findings below may reference images not displayed]

FINDINGS: Rotator cuff: Supraspinatus and infraspinatus tendons are intact
with minimal tendinosis. Subscapularis tendinosis with
partial-thickness tearing the superior aspect of the distal tendon.
Intact teres minor.

Muscles: Preserved muscle bulk and signal intensity.

Biceps long head: Biceps tendon is medially perched at the groove
entry without dislocation. Short longitudinal split tear of the
intra-articular biceps tendon (series 9, images 13-14).

Acromioclavicular Joint: No significant arthropathy of the AC joint.
Trace amount of fluid within the subacromial-subdeltoid bursa.

Glenohumeral Joint: Joint is well distended with injected contrast.
There is synovitis within the joint. Chondral thinning and surface
irregularity including partial-thickness cartilage fissure near the
glenoid anterior chondrolabral junction.

Labrum: Anterior-inferior labrum is diffusely heterogeneous and
irregular compatible with degeneration and tearing. No discrete
superior labral tear evident.

Bones: No fracture. No dislocation. No focal bone lesion.
IMPRESSION: 1. Rotator cuff tendinosis with partial-thickness tearing of the
superior aspect of the distal subscapularis tendon.
2. Short longitudinal split tear of the intra-articular biceps
tendon.
3. Anterior-inferior labral degeneration and tearing.
4. Glenohumeral synovitis.
